# Patient Record
Sex: Female | Born: 1990 | Race: Black or African American | Hispanic: No | Marital: Single | Smoking: Never smoker
Health system: Southern US, Community
[De-identification: ages and names within clinical notes are randomized; demographics above are authoritative.]

## PROBLEM LIST (undated history)

## (undated) DIAGNOSIS — J45909 Unspecified asthma, uncomplicated: Secondary | ICD-10-CM

## (undated) DIAGNOSIS — L309 Dermatitis, unspecified: Secondary | ICD-10-CM

## (undated) HISTORY — DX: Unspecified asthma, uncomplicated: J45.909

## (undated) HISTORY — DX: Dermatitis, unspecified: L30.9

---

## 2012-08-11 ENCOUNTER — Encounter (HOSPITAL_BASED_OUTPATIENT_CLINIC_OR_DEPARTMENT_OTHER): Payer: Self-pay | Admitting: *Deleted

## 2012-08-11 ENCOUNTER — Emergency Department (HOSPITAL_BASED_OUTPATIENT_CLINIC_OR_DEPARTMENT_OTHER): Payer: No Typology Code available for payment source

## 2012-08-11 ENCOUNTER — Emergency Department (HOSPITAL_BASED_OUTPATIENT_CLINIC_OR_DEPARTMENT_OTHER)
Admission: EM | Admit: 2012-08-11 | Discharge: 2012-08-11 | Disposition: A | Discharge status: Home / Self Care | Payer: No Typology Code available for payment source | Attending: Emergency Medicine | Admitting: Emergency Medicine

## 2012-08-11 DIAGNOSIS — Y92410 Unspecified street and highway as the place of occurrence of the external cause: Secondary | ICD-10-CM | POA: Insufficient documentation

## 2012-08-11 DIAGNOSIS — M545 Low back pain, unspecified: Secondary | ICD-10-CM | POA: Insufficient documentation

## 2012-08-11 DIAGNOSIS — M25569 Pain in unspecified knee: Secondary | ICD-10-CM | POA: Insufficient documentation

## 2012-08-11 DIAGNOSIS — M542 Cervicalgia: Secondary | ICD-10-CM | POA: Insufficient documentation

## 2012-08-11 MED ORDER — IBUPROFEN 800 MG PO TABS: 800.0000 mg | ORAL_TABLET | Freq: Three times a day (TID) | ORAL | Status: DC

## 2012-08-11 MED ORDER — IBUPROFEN 800 MG PO TABS
800.0000 mg | ORAL_TABLET | Freq: Once | ORAL | Status: AC
  Administered 2012-08-11: 800 mg via ORAL
  Filled 2012-08-11: qty 1
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
## 2012-08-17 ENCOUNTER — Emergency Department (HOSPITAL_BASED_OUTPATIENT_CLINIC_OR_DEPARTMENT_OTHER)
Admission: EM | Admit: 2012-08-17 | Discharge: 2012-08-17 | Disposition: A | Discharge status: Home / Self Care | Payer: BC Managed Care – PPO | Attending: Emergency Medicine | Admitting: Emergency Medicine

## 2012-08-17 ENCOUNTER — Encounter (HOSPITAL_BASED_OUTPATIENT_CLINIC_OR_DEPARTMENT_OTHER): Payer: Self-pay | Admitting: *Deleted

## 2012-08-17 DIAGNOSIS — L02219 Cutaneous abscess of trunk, unspecified: Secondary | ICD-10-CM | POA: Insufficient documentation

## 2012-08-17 DIAGNOSIS — L0291 Cutaneous abscess, unspecified: Secondary | ICD-10-CM

## 2012-08-17 MED ORDER — HYDROCODONE-ACETAMINOPHEN 5-500 MG PO TABS: 1.0000 | ORAL_TABLET | Freq: Four times a day (QID) | ORAL | Status: DC | PRN

## 2012-08-17 MED ORDER — SULFAMETHOXAZOLE-TRIMETHOPRIM 800-160 MG PO TABS: 1.0000 | ORAL_TABLET | Freq: Two times a day (BID) | ORAL | Status: DC

## 2012-08-17 MED ORDER — LIDOCAINE HCL 2 % IJ SOLN
INTRAMUSCULAR | Status: AC
  Administered 2012-08-17: 400 mg
  Filled 2012-08-17: qty 20

## 2012-08-17 MED ORDER — CLINDAMYCIN HCL 300 MG PO CAPS: 300.0000 mg | ORAL_CAPSULE | Freq: Four times a day (QID) | ORAL | Status: DC

## 2012-08-17 NOTE — ED Provider Notes (Signed)
History     CSN: [id number]  Arrival date & time 08/17/12  1714   First MD Initiated Contact with Patient 08/17/12 1828      Chief Complaint  Patient presents with  . Abscess    (Consider location/radiation/quality/duration/timing/severity/associated sxs/prior treatment) Patient is a 21 y.o. female presenting with abscess. The history is provided by the patient.  Abscess  This is a new problem. Episode onset: 2 days ago. The onset was gradual. The problem occurs continuously. The problem has been gradually worsening. Affected Location: left torso. The problem is moderate. The abscess is characterized by redness, painfulness and swelling. It is unknown what she was exposed to.    History reviewed. No pertinent past medical history.  Past Surgical History  Procedure Date  . Cesarean section     History reviewed. No pertinent family history.  History  Substance Use Topics  . Smoking status: Never Smoker   . Smokeless tobacco: Not on file  . Alcohol Use: Yes    OB History    Grav Para Term Preterm Abortions TAB SAB Ect Mult Living                  Review of Systems  All other systems reviewed and are negative.    Allergies  Amoxicillin and Septra  Home Medications   Current Outpatient Rx  Name Route Sig Dispense Refill  . IBUPROFEN 800 MG PO TABS Oral Take 1 tablet (800 mg total) by mouth 3 (three) times daily. 21 tablet 0    BP 129/83  Pulse 78  Temp 99.1 F (37.3 C) (Oral)  Resp 16  Ht 5\' 5"  (1.651 m)  Wt 265 lb (120.203 kg)  BMI 44.10 kg/m2  SpO2 100%  LMP 07/22/2012  Physical Exam  Nursing note and vitals reviewed. Constitutional: She is oriented to person, place, and time. She appears well-developed and well-nourished. No distress.  HENT:  Head: Normocephalic and atraumatic.  Neck: Normal range of motion. Neck supple.  Musculoskeletal: Normal range of motion.  Neurological: She is alert and oriented to person, place, and time.  Skin: She  is not diaphoretic.       There is a 2.5 cm round fluctuant erythematous lesion to the left chest wall lateral to the breast.      ED Course  Procedures (including critical care time)  Labs Reviewed - No data to display No results found.   No diagnosis found.  INCISION AND DRAINAGE Performed by: Geoffery Lyons Consent: Verbal consent obtained. Risks and benefits: risks, benefits and alternatives were discussed Type: abscess  Body area: left torso  Anesthesia: local infiltration  Local anesthetic: lidocaine 1% without epinephrine  Anesthetic total: 2 ml  Complexity: complex Blunt dissection to break up loculations  Drainage: purulent  Drainage amount: moderate  Packing material: none  Patient tolerance: Patient tolerated the procedure well with no immediate complications.     MDM  Will treat with pain meds, bactrim, warm soaks.  Return prn.        Geoffery Lyons, MD 08/17/12 [phone]

## 2012-08-17 NOTE — ED Notes (Signed)
MD at bedside. 

## 2012-08-17 NOTE — ED Notes (Signed)
Pt c/o abscess to left flank x 3 days

## 2013-01-14 ENCOUNTER — Emergency Department (HOSPITAL_BASED_OUTPATIENT_CLINIC_OR_DEPARTMENT_OTHER)
Admission: EM | Admit: 2013-01-14 | Discharge: 2013-01-14 | Disposition: A | Discharge status: Home / Self Care | Payer: BC Managed Care – PPO | Attending: Emergency Medicine | Admitting: Emergency Medicine

## 2013-01-14 ENCOUNTER — Encounter (HOSPITAL_BASED_OUTPATIENT_CLINIC_OR_DEPARTMENT_OTHER): Payer: Self-pay | Admitting: Emergency Medicine

## 2013-01-14 DIAGNOSIS — Z87440 Personal history of urinary (tract) infections: Secondary | ICD-10-CM | POA: Insufficient documentation

## 2013-01-14 DIAGNOSIS — B9689 Other specified bacterial agents as the cause of diseases classified elsewhere: Secondary | ICD-10-CM

## 2013-01-14 DIAGNOSIS — Z3202 Encounter for pregnancy test, result negative: Secondary | ICD-10-CM | POA: Insufficient documentation

## 2013-01-14 DIAGNOSIS — N949 Unspecified condition associated with female genital organs and menstrual cycle: Secondary | ICD-10-CM | POA: Insufficient documentation

## 2013-01-14 DIAGNOSIS — N760 Acute vaginitis: Secondary | ICD-10-CM | POA: Insufficient documentation

## 2013-01-14 LAB — URINALYSIS, ROUTINE W REFLEX MICROSCOPIC
Glucose, UA: NEGATIVE mg/dL
Nitrite: NEGATIVE
Specific Gravity, Urine: 1.027 (ref 1.005–1.030)
pH: 6.5 (ref 5.0–8.0)

## 2013-01-14 LAB — WET PREP, GENITAL
Trich, Wet Prep: NONE SEEN
Yeast Wet Prep HPF POC: NONE SEEN

## 2013-01-14 LAB — PREGNANCY, URINE: Preg Test, Ur: NEGATIVE

## 2013-01-14 LAB — URINE MICROSCOPIC-ADD ON

## 2013-01-14 MED ORDER — METRONIDAZOLE 500 MG PO TABS: 500.0000 mg | ORAL_TABLET | Freq: Two times a day (BID) | ORAL | Status: DC

## 2013-01-14 MED ORDER — NITROFURANTOIN MONOHYD MACRO 100 MG PO CAPS: 100.0000 mg | ORAL_CAPSULE | Freq: Two times a day (BID) | ORAL | Status: AC

## 2013-01-14 NOTE — ED Provider Notes (Signed)
Medical screening examination/treatment/procedure(s) were performed by non-physician practitioner and as supervising physician I was immediately available for consultation/collaboration.   Loren Racer, MD 01/14/13 [phone]

## 2013-01-14 NOTE — ED Provider Notes (Signed)
History     CSN: [id number]  Arrival date & time 01/14/13  1737   First MD Initiated Contact with Patient 01/14/13 1803      Chief Complaint  Patient presents with  . Dysuria    (Consider location/radiation/quality/duration/timing/severity/associated sxs/prior treatment) Patient is a 22 y.o. female presenting with dysuria. The history is provided by the patient. No language interpreter was used.  Dysuria  This is a new problem. The problem occurs every urination. The problem has been gradually worsening. The pain is at a severity of 6/10. The pain is moderate. There has been no fever. She is not sexually active. She has tried nothing for the symptoms.  Pt reports she has had multiple urinary tract infections in the past  No past medical history on file.  Past Surgical History  Procedure Laterality Date  . Cesarean section      No family history on file.  History  Substance Use Topics  . Smoking status: Never Smoker   . Smokeless tobacco: Not on file  . Alcohol Use: Yes    OB History   Grav Para Term Preterm Abortions TAB SAB Ect Mult Living                  Review of Systems  Genitourinary: Positive for dysuria and vaginal pain.  All other systems reviewed and are negative.    Allergies  Amoxicillin and Septra  Home Medications   Current Outpatient Rx  Name  Route  Sig  Dispense  Refill  . ethynodiol-ethinyl estradiol (KELNOR,ZOVIA) 1-35 MG-MCG tablet   Oral   Take 1 tablet by mouth daily.         . montelukast (SINGULAIR) 10 MG tablet   Oral   Take 10 mg by mouth at bedtime.           BP 143/78  Pulse 70  Temp(Src) 98.2 F (36.8 C) (Oral)  Resp 15  Ht 5\' 5"  (1.651 m)  Wt 265 lb (120.203 kg)  BMI 44.1 kg/m2  SpO2 100%  LMP 01/02/2013  Physical Exam  Nursing note and vitals reviewed. Constitutional: She appears well-developed and well-nourished.  HENT:  Head: Normocephalic and atraumatic.  Right Ear: External ear normal.  Eyes:  Conjunctivae and EOM are normal. Pupils are equal, round, and reactive to light.  Neck: Normal range of motion. Neck supple.  Cardiovascular: Normal rate and normal heart sounds.   Pulmonary/Chest: Effort normal and breath sounds normal.  Abdominal: Soft. Bowel sounds are normal. There is tenderness.  Genitourinary: Uterus normal. Vaginal discharge found.  Musculoskeletal: Normal range of motion.  Neurological: She is alert.  Skin: Skin is warm.  Psychiatric: She has a normal mood and affect.    ED Course  Procedures (including critical care time)  Labs Reviewed  WET PREP, GENITAL - Abnormal; Notable for the following:    Clue Cells Wet Prep HPF POC FEW (*)    WBC, Wet Prep HPF POC MODERATE (*)    All other components within normal limits  URINALYSIS, ROUTINE W REFLEX MICROSCOPIC - Abnormal; Notable for the following:    APPearance CLOUDY (*)    Urobilinogen, UA 2.0 (*)    Leukocytes, UA MODERATE (*)    All other components within normal limits  URINE MICROSCOPIC-ADD ON - Abnormal; Notable for the following:    Squamous Epithelial / LPF MANY (*)    Bacteria, UA MANY (*)    All other components within normal limits  URINE CULTURE  GC/CHLAMYDIA PROBE  AMP  PREGNANCY, URINE   No results found.   No diagnosis found.    MDM  Flagyl 500mg  14 bid        Lonia Skinner [place], [place] 01/14/13 1939

## 2013-01-14 NOTE — ED Notes (Addendum)
Pt having dysuria since Thursday.  Some chills.  Some itching and clear vaginal discharge.

## 2013-01-16 LAB — URINE CULTURE
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
## 2014-03-27 ENCOUNTER — Encounter (HOSPITAL_BASED_OUTPATIENT_CLINIC_OR_DEPARTMENT_OTHER): Payer: Self-pay | Admitting: Emergency Medicine

## 2014-03-27 ENCOUNTER — Emergency Department (HOSPITAL_BASED_OUTPATIENT_CLINIC_OR_DEPARTMENT_OTHER): Payer: BC Managed Care – PPO

## 2014-03-27 ENCOUNTER — Emergency Department (HOSPITAL_BASED_OUTPATIENT_CLINIC_OR_DEPARTMENT_OTHER)
Admission: EM | Admit: 2014-03-27 | Discharge: 2014-03-27 | Disposition: A | Discharge status: Home / Self Care | Payer: BC Managed Care – PPO | Attending: Emergency Medicine | Admitting: Emergency Medicine

## 2014-03-27 DIAGNOSIS — Z3202 Encounter for pregnancy test, result negative: Secondary | ICD-10-CM | POA: Insufficient documentation

## 2014-03-27 DIAGNOSIS — Z792 Long term (current) use of antibiotics: Secondary | ICD-10-CM | POA: Insufficient documentation

## 2014-03-27 DIAGNOSIS — S99929A Unspecified injury of unspecified foot, initial encounter: Principal | ICD-10-CM

## 2014-03-27 DIAGNOSIS — S99919A Unspecified injury of unspecified ankle, initial encounter: Principal | ICD-10-CM

## 2014-03-27 DIAGNOSIS — S8990XA Unspecified injury of unspecified lower leg, initial encounter: Secondary | ICD-10-CM

## 2014-03-27 DIAGNOSIS — Y9339 Activity, other involving climbing, rappelling and jumping off: Secondary | ICD-10-CM | POA: Insufficient documentation

## 2014-03-27 DIAGNOSIS — Z880 Allergy status to penicillin: Secondary | ICD-10-CM | POA: Insufficient documentation

## 2014-03-27 DIAGNOSIS — X500XXA Overexertion from strenuous movement or load, initial encounter: Secondary | ICD-10-CM | POA: Insufficient documentation

## 2014-03-27 DIAGNOSIS — Y929 Unspecified place or not applicable: Secondary | ICD-10-CM | POA: Insufficient documentation

## 2014-03-27 LAB — PREGNANCY, URINE: Preg Test, Ur: NEGATIVE

## 2014-03-27 MED ORDER — IBUPROFEN 800 MG PO TABS
800.0000 mg | ORAL_TABLET | Freq: Once | ORAL | Status: AC
  Administered 2014-03-27: 800 mg via ORAL
  Filled 2014-03-27: qty 1

## 2014-03-27 MED ORDER — HYDROCODONE-ACETAMINOPHEN 5-325 MG PO TABS
1.0000 | ORAL_TABLET | Freq: Once | ORAL | Status: AC
  Administered 2014-03-27: 1 via ORAL
  Filled 2014-03-27: qty 1

## 2014-03-27 MED ORDER — IBUPROFEN 800 MG PO TABS: 800.0000 mg | ORAL_TABLET | Freq: Three times a day (TID) | ORAL | Status: DC

## 2014-03-27 MED ORDER — HYDROCODONE-ACETAMINOPHEN 5-325 MG PO TABS: 2.0000 | ORAL_TABLET | ORAL | Status: DC | PRN

## 2014-03-27 NOTE — Discharge Instructions (Signed)
Knee Pain Knee pain can be a result of an injury or other medical conditions. Treatment will depend on the cause of your pain. HOME CARE  Only take medicine as told by your doctor.  Keep a healthy weight. Being overweight can make the knee hurt more.  Stretch before exercising or playing sports.  If there is constant knee pain, change the way you exercise. Ask your doctor for advice.  Make sure shoes fit well. Choose the right shoe for the sport or activity.  Protect your knees. Wear kneepads if needed.  Rest when you are tired. GET HELP RIGHT AWAY IF:   Your knee pain does not stop.  Your knee pain does not get better.  Your knee joint feels hot to the touch.  You have a fever. MAKE SURE YOU:   Understand these instructions.  Will watch this condition.  Will get help right away if you are not doing well or get worse. Document Released: 01/15/2009 Document Revised: 01/11/2012 Document Reviewed: 01/15/2009 [employer] Patient Information 2014 [employer].  

## 2014-03-27 NOTE — ED Notes (Signed)
patient aware of needed urine sample.
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
## 2014-06-03 ENCOUNTER — Emergency Department (HOSPITAL_BASED_OUTPATIENT_CLINIC_OR_DEPARTMENT_OTHER)
Admission: EM | Admit: 2014-06-03 | Discharge: 2014-06-03 | Disposition: A | Discharge status: Home / Self Care | Payer: BC Managed Care – PPO | Attending: Emergency Medicine | Admitting: Emergency Medicine

## 2014-06-03 ENCOUNTER — Encounter (HOSPITAL_BASED_OUTPATIENT_CLINIC_OR_DEPARTMENT_OTHER): Payer: Self-pay | Admitting: Emergency Medicine

## 2014-06-03 DIAGNOSIS — Z880 Allergy status to penicillin: Secondary | ICD-10-CM | POA: Insufficient documentation

## 2014-06-03 DIAGNOSIS — H66012 Acute suppurative otitis media with spontaneous rupture of ear drum, left ear: Secondary | ICD-10-CM

## 2014-06-03 DIAGNOSIS — J0100 Acute maxillary sinusitis, unspecified: Secondary | ICD-10-CM | POA: Insufficient documentation

## 2014-06-03 DIAGNOSIS — R51 Headache: Secondary | ICD-10-CM | POA: Insufficient documentation

## 2014-06-03 DIAGNOSIS — H66019 Acute suppurative otitis media with spontaneous rupture of ear drum, unspecified ear: Secondary | ICD-10-CM | POA: Insufficient documentation

## 2014-06-03 DIAGNOSIS — Z792 Long term (current) use of antibiotics: Secondary | ICD-10-CM | POA: Insufficient documentation

## 2014-06-03 DIAGNOSIS — Z791 Long term (current) use of non-steroidal anti-inflammatories (NSAID): Secondary | ICD-10-CM | POA: Insufficient documentation

## 2014-06-03 MED ORDER — AZITHROMYCIN 250 MG PO TABS: 250.0000 mg | ORAL_TABLET | Freq: Every day | ORAL | Status: DC

## 2014-06-03 MED ORDER — LORATADINE 10 MG PO TABS: 10.0000 mg | ORAL_TABLET | Freq: Every day | ORAL | Status: DC | PRN
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
## 2014-06-03 NOTE — Discharge Instructions (Signed)
Otitis Media With Effusion Otitis media with effusion is the presence of fluid in the middle ear. This is a common problem in children, which often follows ear infections. It may be present for weeks or longer after the infection. Unlike an acute ear infection, otitis media with effusion refers only to fluid behind the ear drum and not infection. Children with repeated ear and sinus infections and allergy problems are the most likely to get otitis media with effusion. CAUSES  The most frequent cause of the fluid buildup is dysfunction of the eustachian tubes. These are the tubes that drain fluid in the ears to the back of the nose (nasopharynx). SYMPTOMS   The main symptom of this condition is hearing loss. As a result, you or your child may:  Listen to the TV at a loud volume.  Not respond to questions.  Ask "what" often when spoken to.  Mistake or confuse one sound or word for another.  There may be a sensation of fullness or pressure but usually not pain. DIAGNOSIS   Your health care provider will diagnose this condition by examining you or your child's ears.  Your health care provider may test the pressure in you or your child's ear with a tympanometer.  A hearing test may be conducted if the problem persists. TREATMENT   Treatment depends on the duration and the effects of the effusion.  Antibiotics, decongestants, nose drops, and cortisone-type drugs (tablets or nasal spray) may not be helpful.  Children with persistent ear effusions may have delayed language or behavioral problems. Children at risk for developmental delays in hearing, learning, and speech may require referral to a specialist earlier than children not at risk.  You or your child's health care provider may suggest a referral to an ear, nose, and throat surgeon for treatment. The following may help restore normal hearing:  Drainage of fluid.  Placement of ear tubes (tympanostomy tubes).  Removal of adenoids  (adenoidectomy). HOME CARE INSTRUCTIONS   Avoid secondhand smoke.  Infants who are breastfed are less likely to have this condition.  Avoid feeding infants while they are lying flat.  Avoid known environmental allergens.  Avoid people who are sick. SEEK MEDICAL CARE IF:   Hearing is not better in 3 months.  Hearing is worse.  Ear pain.  Drainage from the ear.  Dizziness. MAKE SURE YOU:   Understand these instructions.  Will watch your condition.  Will get help right away if you are not doing well or get worse. Document Released: 11/26/2004 Document Revised: 03/05/2014 Document Reviewed: 05/16/2013 [employer] Patient Information 2015 [employer]. This information is not intended to replace advice given to you by your health care provider. Make sure you discuss any questions you have with your health care provider. Sinusitis Sinusitis is redness, soreness, and inflammation of the paranasal sinuses. Paranasal sinuses are air pockets within the bones of your face (beneath the eyes, the middle of the forehead, or above the eyes). In healthy paranasal sinuses, mucus is able to drain out, and air is able to circulate through them by way of your nose. However, when your paranasal sinuses are inflamed, mucus and air can become trapped. This can allow bacteria and other germs to grow and cause infection. Sinusitis can develop quickly and last only a short time (acute) or continue over a long period (chronic). Sinusitis that lasts for more than 12 weeks is considered chronic.  CAUSES  Causes of sinusitis include:  Allergies.  Structural abnormalities, such as   displacement of the cartilage that separates your nostrils (deviated septum), which can decrease the air flow through your nose and sinuses and affect sinus drainage.  Functional abnormalities, such as when the small hairs (cilia) that line your sinuses and help remove mucus do not work properly or are not present. SIGNS AND  SYMPTOMS  Symptoms of acute and chronic sinusitis are the same. The primary symptoms are pain and pressure around the affected sinuses. Other symptoms include:  Upper toothache.  Earache.  Headache.  Bad breath.  Decreased sense of smell and taste.  A cough, which worsens when you are lying flat.  Fatigue.  Fever.  Thick drainage from your nose, which often is green and may contain pus (purulent).  Swelling and warmth over the affected sinuses. DIAGNOSIS  Your health care provider will perform a physical exam. During the exam, your health care provider may:  Look in your nose for signs of abnormal growths in your nostrils (nasal polyps).  Tap over the affected sinus to check for signs of infection.  View the inside of your sinuses (endoscopy) using an imaging device that has a light attached (endoscope). If your health care provider suspects that you have chronic sinusitis, one or more of the following tests may be recommended:  Allergy tests.  Nasal culture. A sample of mucus is taken from your nose, sent to a lab, and screened for bacteria.  Nasal cytology. A sample of mucus is taken from your nose and examined by your health care provider to determine if your sinusitis is related to an allergy. TREATMENT  Most cases of acute sinusitis are related to a viral infection and will resolve on their own within 10 days. Sometimes medicines are prescribed to help relieve symptoms (pain medicine, decongestants, nasal steroid sprays, or saline sprays).  However, for sinusitis related to a bacterial infection, your health care provider will prescribe antibiotic medicines. These are medicines that will help kill the bacteria causing the infection.  Rarely, sinusitis is caused by a fungal infection. In theses cases, your health care provider will prescribe antifungal medicine. For some cases of chronic sinusitis, surgery is needed. Generally, these are cases in which sinusitis recurs  more than 3 times per year, despite other treatments. HOME CARE INSTRUCTIONS   Drink plenty of water. Water helps thin the mucus so your sinuses can drain more easily.  Use a humidifier.  Inhale steam 3 to 4 times a day (for example, sit in the bathroom with the shower running).  Apply a warm, moist washcloth to your face 3 to 4 times a day, or as directed by your health care provider.  Use saline nasal sprays to help moisten and clean your sinuses.  Take medicines only as directed by your health care provider.  If you were prescribed either an antibiotic or antifungal medicine, finish it all even if you start to feel better. SEEK IMMEDIATE MEDICAL CARE IF:  You have increasing pain or severe headaches.  You have nausea, vomiting, or drowsiness.  You have swelling around your face.  You have vision problems.  You have a stiff neck.  You have difficulty breathing. MAKE SURE YOU:   Understand these instructions.  Will watch your condition.  Will get help right away if you are not doing well or get worse. Document Released: 10/19/2005 Document Revised: 03/05/2014 Document Reviewed: 11/03/2011 [employer] Patient Information 2015 [employer]. This information is not intended to replace advice given to you by your health care provider. Make sure   you discuss any questions you have with your health care provider.  

## 2014-06-03 NOTE — ED Notes (Signed)
Pt reports sinus and ear pressure since yesterday with cough.

## 2020-09-30 DIAGNOSIS — A6004 Herpesviral vulvovaginitis: Secondary | ICD-10-CM | POA: Insufficient documentation

## 2022-09-22 ENCOUNTER — Ambulatory Visit: Payer: Managed Care, Other (non HMO) | Admitting: Internal Medicine

## 2022-09-22 ENCOUNTER — Other Ambulatory Visit (HOSPITAL_COMMUNITY): Payer: Self-pay

## 2022-09-22 ENCOUNTER — Other Ambulatory Visit: Payer: Self-pay | Admitting: Internal Medicine

## 2022-09-22 ENCOUNTER — Encounter: Payer: Self-pay | Admitting: Internal Medicine

## 2022-09-22 VITALS — BP 114/70 | HR 83 | Temp 97.9°F | Resp 17 | Ht 66.05 in | Wt 274.4 lb

## 2022-09-22 DIAGNOSIS — L2084 Intrinsic (allergic) eczema: Secondary | ICD-10-CM

## 2022-09-22 DIAGNOSIS — H1045 Other chronic allergic conjunctivitis: Secondary | ICD-10-CM | POA: Diagnosis not present

## 2022-09-22 DIAGNOSIS — J3089 Other allergic rhinitis: Secondary | ICD-10-CM | POA: Diagnosis not present

## 2022-09-22 DIAGNOSIS — R062 Wheezing: Secondary | ICD-10-CM

## 2022-09-22 DIAGNOSIS — T7800XA Anaphylactic reaction due to unspecified food, initial encounter: Secondary | ICD-10-CM | POA: Diagnosis not present

## 2022-09-22 DIAGNOSIS — J302 Other seasonal allergic rhinitis: Secondary | ICD-10-CM

## 2022-09-22 MED ORDER — AZELASTINE HCL 0.1 % NA SOLN: 2.0000 | Freq: Two times a day (BID) | NASAL | 6 refills | Status: DC

## 2022-09-22 MED ORDER — HYDROCORTISONE 2.5 % EX OINT: TOPICAL_OINTMENT | CUTANEOUS | 3 refills | Status: DC

## 2022-09-22 MED ORDER — EUCRISA 2 % EX OINT: TOPICAL_OINTMENT | CUTANEOUS | 5 refills | Status: DC

## 2022-09-22 MED ORDER — MONTELUKAST SODIUM 10 MG PO TABS: 10.0000 mg | ORAL_TABLET | Freq: Every day | ORAL | 1 refills | Status: DC

## 2022-09-22 MED ORDER — ALBUTEROL SULFATE HFA 108 (90 BASE) MCG/ACT IN AERS: 2.0000 | INHALATION_SPRAY | Freq: Four times a day (QID) | RESPIRATORY_TRACT | 2 refills | Status: DC | PRN

## 2022-09-22 MED ORDER — FLUTICASONE PROPIONATE 50 MCG/ACT NA SUSP: 2.0000 | Freq: Every day | NASAL | 1 refills | Status: AC

## 2022-09-22 MED ORDER — EPINEPHRINE 0.3 MG/0.3ML IJ SOAJ: 0.3000 mg | INTRAMUSCULAR | 1 refills | Status: DC | PRN

## 2022-09-22 NOTE — Progress Notes (Signed)
New Patient Note  RE: Kerry Smith MRN: [id number] DOB: 03/29/1991 Date of Office Visit: 09/22/2022  Consult requested by: Windell Hummingbird, PA-C Primary care provider: Windell Hummingbird, PA-C  Chief Complaint: Allergic Rhinitis  (Pt states that she's been having a lot of allergy symptoms flaring up. Mainly around eyes she's been breaking out, eyes been really itchy, dry, red. She also been very congested, wheezing, runny nose.) and Dry Eye  History of Present Illness: I had the pleasure of seeing Kerry Smith for initial evaluation at the [facility] [place] [facility] [place] on 09/22/2022. She is a 31 y.o. female, who is referred here by Windell Hummingbird, PA-C for the evaluation of food allergies, atopic dermatitis  and rhinitis .  History obtained from patient, chart review.  Concern for Food Allergy:  Food of concern: pineapple, tree nuts, dairy, seafood  History of reaction: citrus causes mouth itching, she is avoiding tree nuts and almonds from previous testing.  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 female with: Intrinsic atopic dermatitis - Plan: Allergy Test, Interdermal Allergy Test  Allergy with anaphylaxis due to food - Plan: Allergy Test, Interdermal Allergy Test  Seasonal and perennial allergic rhinitis - Plan: Allergy Test, Interdermal Allergy Test  Other chronic allergic conjunctivitis of both eyes - Plan: Allergy Test, Interdermal Allergy Test  Wheezing - Plan: Allergy Test, Spirometry with Graph, Interdermal Allergy Test Plan: Patient Instructions  Atopic Dermatitis: not well controlled  Daily Care For Maintenance (daily and continue even once eczema controlled) - Recommend hypoallergenic hydrating ointment at least twice daily.  This must be done daily for control of flares. (Great options include Vaseline, CeraVe, Aquaphor, Aveeno, Cetaphil, VaniCream, etc) - Recommend avoiding detergents, soaps or lotions with fragrances/dyes, and instead using products which are hypoallergenic, use second rinse cycle when washing clothes -Wear lose breathable clothing, avoid wool -Avoid extremes of humidity - Limit showers/baths to 5 minutes and use luke warm water instead of hot, pat dry following baths, and apply moisturizer - START Eucrisa twice daily  - can use steroid creams as detailed below up to twice weekly for prevention of flares.  For Flares:(add this to maintenance therapy if needed for flares) - Hydrocortisone 2.5% to face, armpit or groin-apply topically twice daily to red, raised areas of skin, followed by moisturizer -Can use with Eucrisa   Seasonal and Perennial  Rhinitis not well controlled : - allergy testing today was skin prick positive to grass pollen, weed pollen, tree  pollen, mold, dust mite Intradermal testing positive to cat, dog, roach - allergen avoidance as below - consider allergy shots as  long term control of your symptoms by teaching your immune system to be more tolerant of your allergy triggers - STOP allegra -D (the D portion is not good to take long term)  - Start Nasal Steroid Spray: Options include Flonase (fluticasone), Nasocort (triamcinolone), Nasonex (mometasome) 1- 2 sprays in each nostril daily (can buy over-the-counter if not covered by insurance)  Best results if used daily. - Start Astelin (Azelastine) 1-2 sprays in each nostril twice a day as needed.  You may use this as needed for nasal congestion/itchy ears/itchy nose if desired - Start Singulair (Montelukast) 10mg  nightly. - Continue over the counter antihistamine daily or daily as needed.   -Your options include Zyrtec (Cetirizine) 10mg , Claritin (Loratadine) 10mg , Allegra (Fexofenadine) 180mg , or Xyzal (Levocetirinze) 5mg   Allergic Conjunctivitis:  - Consider Allergy Eye drops: great options include Pataday (Olopatadine) or Zaditor (ketotifen) for eye symptoms daily as needed-both sold over the counter if not covered by insurance.   -Avoid eye drops that say red eye relief as they may contain medications that dry out your eyes.  Food allergy:  - today's skin testing was positive to hazelnut and pistachio  - please strictly avoid tree nuts  - okay to resume eating seafood, dairy - for SKIN only reaction, okay to take Benadryl 2 capsules every 6 hours - for SKIN + ANY additional symptoms, OR IF concern for LIFE THREATENING reaction = Epipen Autoinjector EpiPen 0.3 mg. - If using Epinephrine autoinjector, call 911 - A food allergy action plan has been provided and discussed. - Medic Alert identification is recommended.  Follow up: 4 weeks   Thank you so much for letting me partake in your care today.  Don't hesitate to reach out if you have any additional concerns!  Roney Marion, MD  [facility] [place] [place]   Reducing Pollen Exposure  The American Academy of Allergy, Asthma and  Immunology suggests the following steps to reduce your exposure to pollen during allergy seasons.    Do not hang sheets or clothing out to dry; pollen may collect on these items. Do not mow lawns or spend time around freshly cut grass; mowing stirs up pollen. Keep windows closed at night.  Keep car windows closed while driving. Minimize morning activities outdoors, a time when pollen counts are usually at their highest. Stay indoors as much as possible when pollen counts or humidity is high and on windy days when pollen tends to remain in the air longer. Use air conditioning when possible.  Many air conditioners have filters that trap the pollen spores. Use a HEPA room air filter to remove pollen form the indoor air you breathe.  DUST MITE AVOIDANCE MEASURES:  There are three main measures that need and can be taken to avoid house dust mites:  Reduce accumulation of dust in general -reduce furniture, clothing, carpeting, books, stuffed animals, especially in bedroom  Separate yourself from the dust -use pillow and mattress encasements (can be found at stores such as [employer] or online) -avoid direct exposure to air condition flow -use a HEPA filter device, especially in the bedroom; you can also use a HEPA filter vacuum cleaner -wipe dust with a moist towel instead of a dry towel or broom when cleaning  Decrease mites and/or their secretions -wash clothing and linen and stuffed animals at highest temperature possible, at  least every 2 weeks -stuffed animals can also be placed in a bag and put in a freezer overnight  Despite the above measures, it is impossible to eliminate dust mites or their allergen completely from your home.  With the above measures the burden of mites in your home can be diminished, with the goal of minimizing your allergic symptoms.  Success will be reached only when implementing and using all means together.  Control of Cockroach Allergen  Cockroach  allergen has been identified as an important cause of acute attacks of asthma, especially in urban settings.  There are fifty-five species of cockroach that exist in the [place], however only three, the [place], [place] species produce allergen that can affect patients with Asthma.  Allergens can be obtained from fecal particles, egg casings and secretions from cockroaches.    Remove food sources. Reduce access to water. Seal access and entry points. Spray runways with 0.5-1% Diazinon or Chlorpyrifos Blow boric acid power under stoves and refrigerator. Place bait stations (hydramethylnon) at feeding sites.  Control of Dog or Cat Allergen  Avoidance is the best way to manage a dog or cat allergy. If you have a dog or cat and are allergic to dog or cats, consider removing the dog or cat from the home. If you have a dog or cat but don't want to find it a new home, or if your family wants a pet even though someone in the household is allergic, here are some strategies that may help keep symptoms at bay:  Keep the pet out of your bedroom and restrict it to only a few rooms. Be advised that keeping the dog or cat in only one room will not limit the allergens to that room. Don't pet, hug or kiss the dog or cat; if you do, wash your hands with soap and water. High-efficiency particulate air (HEPA) cleaners run continuously in a bedroom or living room can reduce allergen levels over time. Regular use of a high-efficiency vacuum cleaner or a central vacuum can reduce allergen levels. Giving your dog or cat a bath at least once a week can reduce airborne allergen.  Control of Mold Allergen   Mold and fungi can grow on a variety of surfaces provided certain temperature and moisture conditions exist.  Outdoor molds grow on plants, decaying vegetation and soil.  The major outdoor mold, Alternaria and Cladosporium, are found in very high numbers during hot and dry conditions.  Generally, a  late Summer - Fall peak is seen for common outdoor fungal spores.  Rain will temporarily lower outdoor mold spore count, but counts rise rapidly when the rainy period ends.  The most important indoor molds are Aspergillus and Penicillium.  Dark, humid and poorly ventilated basements are ideal sites for mold growth.  The next most common sites of mold growth are the bathroom and the kitchen.  Outdoor (Seasonal) Mold Control  Positive outdoor molds via skin testing: Cladosporium  Use air conditioning and keep windows closed Avoid exposure to decaying vegetation. Avoid leaf raking. Avoid grain handling. Consider wearing a face mask if working in moldy areas.       Meds ordered this encounter  Medications   albuterol (VENTOLIN HFA) 108 (90 Base) MCG/ACT inhaler    Sig: Inhale 2 puffs into the lungs every 6 (six) hours as needed for wheezing or shortness of breath.    Dispense:  8 g    Refill:  2   EUCRISA 2 % OINT  Sig: Apply  Topical Twice Daily    Dispense:  60 g    Refill:  5   hydrocortisone 2.5 % ointment    Sig: Apply to face, armpit or groin-apply topically twice daily to red, raised areas of skin, followed by moisturizer    Dispense:  30 g    Refill:  3   azelastine (ASTELIN) 0.1 % nasal spray    Sig: Place 2 sprays into both nostrils 2 (two) times daily. Use in each nostril as directed    Dispense:  30 mL    Refill:  6   fluticasone (FLONASE) 50 MCG/ACT nasal spray    Sig: Place 2 sprays into both nostrils daily.    Dispense:  16 g    Refill:  1   montelukast (SINGULAIR) 10 MG tablet    Sig: Take 1 tablet (10 mg total) by mouth at bedtime.    Dispense:  90 tablet    Refill:  1   EPINEPHrine 0.3 mg/0.3 mL IJ SOAJ injection    Sig: Inject 0.3 mg into the muscle as needed for anaphylaxis.    Dispense:  1 each    Refill:  1   Lab Orders  No laboratory test(s) ordered today    Other allergy screening: Asthma: yes Rhino conjunctivitis: yes Food allergy:  yes Medication allergy: yes Hymenoptera allergy: no Urticaria: no Eczema:yes History of recurrent infections suggestive of immunodeficency: no  Diagnostics: Spirometry:  Tracings reviewed. Her effort: Good reproducible efforts. FVC: 3.94L FEV1: 3.35L, 114% predicted FEV1/FVC ratio: 85% Interpretation: Spirometry consistent with normal pattern.  Please see scanned spirometry results for details.  Skin Testing: Environmental allergy panel and select foods. - allergy testing today was skin prick positive to grass pollen, weed pollen, tree pollen, mold, dust mite Intradermal testing positive to cat, dog, roach Results interpreted by myself and discussed with patient/family.  Airborne Adult Perc - 09/22/22 1007     Time Antigen Placed 1007    Allergen Manufacturer Greer    Location Back    Number of Test 58    Panel 1 Select    1. Control-Buffer 50% Glycerol Negative    2. Control-Histamine 1 mg/ml 4+    3. Albumin saline Negative    4. Bahia 3+    5. [place] 4+    6. Johnson 3+    7. [place] Blue 4+    9. Perennial Rye Negative    10. Sweet Vernal Negative    11. Timothy Negative    12. Cocklebur Negative    13. Burweed Marshelder Negative    14. Ragweed, short 4+    15. Ragweed, Giant 3+    16. Plantain,  English Negative    17. Lamb's Quarters 3+    18. Sheep Sorrell Negative    19. Rough Pigweed 3+    20. Marsh Elder, Rough Negative    21. Mugwort, Common Negative    22. Ash mix 4+    23. Birch mix 4+    24. Beech American 3+    25. Box, Elder 3+    26. Cedar, red Negative    27. Cottonwood, Guinea[place]Biss[place] Negative    28. Elm mix 3+    29. Hickory 4+    30. Maple mix 3+    31. Oak, [place] mix 4+    32. Pecan Pollen 4+    33. Pine mix 3+    34. Sycamore Eastern Negative    35. Walnut, Black Pollen 4+  36. Alternaria alternata Negative    37. Cladosporium Herbarum 3+    38. Aspergillus mix Negative    39. Penicillium mix Negative    40. Bipolaris sorokiniana  (Helminthosporium) Negative    41. Drechslera spicifera (Curvularia) Negative    42. Mucor plumbeus Negative    43. Fusarium moniliforme Negative    44. Aureobasidium pullulans (pullulara) Negative    45. Rhizopus oryzae Negative    46. Botrytis cinera Negative    47. Epicoccum nigrum Negative    48. Phoma betae Negative    49. Candida Albicans Negative    50. Trichophyton mentagrophytes Negative    51. Mite, D Farinae  5,000 AU/ml 4+    52. Mite, D Pteronyssinus  5,000 AU/ml 4+    53. Cat Hair 10,000 BAU/ml Negative    54.  Dog Epithelia Negative    55. Mixed Feathers Negative    56. Horse Epithelia Negative    57. Cockroach, German Negative    58. Mouse Negative    59. Tobacco Leaf Negative             Intradermal - 09/22/22 1134     Time Antigen Placed 1134    Allergen Manufacturer Greer    Location Arm    Number of Test 8    Intradermal Select    Control Negative    Mold 1 Negative    Mold 2 Negative    Mold 3 Negative    Mold 4 Negative    Cat 4+    Dog 3+    Cockroach 3+             Food Adult Perc - 09/22/22 1000     Time Antigen Placed 1009    Allergen Manufacturer Greer    Location Back    Number of allergen test 16    1. Peanut Negative    5. Milk, cow Negative    6. Egg White, Chicken Negative    7. Casein Negative    10. Cashew Negative    11. Pecan Food Negative    12. [place] Negative    13. Almond Negative    14. Hazelnut 2+    15. [place] nut Negative    16. Coconut Negative    17. Pistachio 3+    25. Shrimp Negative    26. Crab Negative    27. Lobster Negative    28. Oyster Negative    29. Scallops Negative             Past Medical History: There are no problems to display for this patient.  Past Medical History:  Diagnosis Date   Asthma    Eczema    Past Surgical History: Past Surgical History:  Procedure Laterality Date   CESAREAN SECTION     Medication List:  Current Outpatient Medications  Medication  Sig Dispense Refill   albuterol (VENTOLIN HFA) 108 (90 Base) MCG/ACT inhaler Inhale 2 puffs into the lungs every 6 (six) hours as needed for wheezing or shortness of breath. 8 g 2   azelastine (ASTELIN) 0.1 % nasal spray Place 2 sprays into both nostrils 2 (two) times daily. Use in each nostril as directed 30 mL 6   Diethylpropion HCl CR 75 MG TB24 Take 1 tablet by mouth daily.     doxycycline (ADOXA) 100 MG tablet Take 100 mg by mouth 2 (two) times daily.     EPINEPHrine 0.3 mg/0.3 mL IJ SOAJ injection Inject 0.3 mg into the  muscle as needed for anaphylaxis. 1 each 1   ethynodiol-ethinyl estradiol (KELNOR,ZOVIA) 1-35 MG-MCG tablet Take 1 tablet by mouth daily.     fluconazole (DIFLUCAN) 100 MG tablet Take 1 tablet by mouth daily.     fluticasone (FLONASE) 50 MCG/ACT nasal spray Place 2 sprays into both nostrils daily. 16 g 1   hydrocortisone 2.5 % ointment Apply to face, armpit or groin-apply topically twice daily to red, raised areas of skin, followed by moisturizer 30 g 3   ibuprofen (ADVIL,MOTRIN) 800 MG tablet Take 1 tablet (800 mg total) by mouth 3 (three) times daily. 21 tablet 0   montelukast (SINGULAIR) 10 MG tablet Take 1 tablet (10 mg total) by mouth at bedtime. 90 tablet 1   SIMPESSE 0.15-0.03 &0.01 MG tablet Take 1 tablet by mouth daily.     valACYclovir (VALTREX) 500 MG tablet Take by mouth.     azithromycin (ZITHROMAX Z-PAK) 250 MG tablet Take 1 tablet (250 mg total) by mouth daily. 2 pills (500mg ) day 1 then 1 pill (250mg ) days 2-5. (Patient not taking: Reported on 09/22/2022) 6 tablet 0   cetirizine (ZYRTEC) 10 MG tablet Take 10 mg by mouth daily. (Patient not taking: Reported on 09/22/2022)     EUCRISA 2 % OINT Apply  Topical Twice Daily 60 g 5   HYDROcodone-acetaminophen (NORCO/VICODIN) 5-325 MG per tablet Take 2 tablets by mouth every 4 (four) hours as needed. (Patient not taking: Reported on 09/22/2022) 10 tablet 0   loratadine (CLARITIN) 10 MG tablet Take 1 tablet (10 mg  total) by mouth daily as needed for allergies. (Patient not taking: Reported on 09/22/2022) 3 tablet 00   metroNIDAZOLE (FLAGYL) 500 MG tablet Take 1 tablet (500 mg total) by mouth 2 (two) times daily. (Patient not taking: Reported on 09/22/2022) 14 tablet 0   No current facility-administered medications for this visit.   Allergies: Allergies  Allergen Reactions   Amoxicillin     rash   Septra [Sulfamethoxazole-Trimethoprim]     rash   Social History: Social History   Socioeconomic History   Marital status: Single    Spouse name: Not on file   Number of children: Not on file   Years of education: Not on file   Highest education level: Not on file  Occupational History   Not on file  Tobacco Use   Smoking status: Never   Smokeless tobacco: Not on file  Vaping Use   Vaping Use: Never used  Substance and Sexual Activity   Alcohol use: Yes    Alcohol/week: 1.0 - 2.0 standard drink of alcohol    Types: 1 - 2 Shots of liquor per week    Comment: occasional   Drug use: No   Sexual activity: Yes    Birth control/protection: Pill  Other Topics Concern   Not on file  Social History Narrative   Pt has a son at home   Social Determinants of Health   Financial Resource Strain: Not on file  Food Insecurity: Not on file  Transportation Needs: Not on file  Physical Activity: Not on file  Stress: Not on file  Social Connections: Not on file   Lives in a single-family home, there are no roaches in the house and bed is 2 feet off the floor.  There are no dust mite precautions on better pillows.  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  There is no HEPA filter in the home and home is not  near an interstate industrial area. 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   Objective: BP 114/70   Pulse 83   Temp 97.9 F (36.6 C) (Temporal)   Resp 17   Ht 5' 6.05" (1.678 m)   Wt 274 lb 6.4 oz (124.5 kg)   SpO2 100%   BMI 44.22 kg/m  Body mass index is 44.22 kg/m.  General Appearance:  Alert, cooperative, no distress, appears stated age  Head:  Normocephalic, without obvious abnormality, atraumatic  Eyes:  Conjunctiva clear, EOM's intact  Nose: Nares normal,  edematous nasal mucosa with clear rhinnorhea , hypertrophic turbinates, no visible anterior polyps, and septum midline  Throat: Lips, tongue normal; teeth and gums normal, no tonsillar exudate and + cobblestoning  Neck: Supple, symmetrical  Lungs:   clear to auscultation bilaterally, Respirations unlabored, no coughing  Heart:  regular rate and rhythm and no murmur, Appears well perfused  Extremities: No edema  Skin: Skin color, texture, turgor normal,  eczematous patches periorbitally and on left antecubital fossa  Neurologic: No gross deficits   The plan was reviewed with the patient/family, and all questions/concerned were addressed.  It was my pleasure to see Miette today and participate in her care. Please feel free to contact me with any questions or concerns.  Sincerely,  Roney Marion, MD Allergy & Immunology  [facility] office: [phone] [facility] office: [phone]

## 2022-09-22 NOTE — Telephone Encounter (Signed)
Can we do a PA? 

## 2022-09-22 NOTE — Patient Instructions (Addendum)
Atopic Dermatitis: not well controlled  Daily Care For Maintenance (daily and continue even once eczema controlled) - Recommend hypoallergenic hydrating ointment at least twice daily.  This must be done daily for control of flares. (Great options include Vaseline, CeraVe, Aquaphor, Aveeno, Cetaphil, VaniCream, etc) - Recommend avoiding detergents, soaps or lotions with fragrances/dyes, and instead using products which are hypoallergenic, use second rinse cycle when washing clothes -Wear lose breathable clothing, avoid wool -Avoid extremes of humidity - Limit showers/baths to 5 minutes and use luke warm water instead of hot, pat dry following baths, and apply moisturizer - START Eucrisa twice daily  - can use steroid creams as detailed below up to twice weekly for prevention of flares.  For Flares:(add this to maintenance therapy if needed for flares) - Hydrocortisone 2.5% to face, armpit or groin-apply topically twice daily to red, raised areas of skin, followed by moisturizer -Can use with Eucrisa   Seasonal and Perennial  Rhinitis not well controlled : - allergy testing today was skin prick positive to grass pollen, weed pollen, tree pollen, mold, dust mite Intradermal testing positive to cat, dog, roach - allergen avoidance as below - consider allergy shots as long term control of your symptoms by teaching your immune system to be more tolerant of your allergy triggers - STOP allegra -D (the D portion is not good to take long term)  - Start Nasal Steroid Spray: Options include Flonase (fluticasone), Nasocort (triamcinolone), Nasonex (mometasome) 1- 2 sprays in each nostril daily (can buy over-the-counter if not covered by insurance)  Best results if used daily. - Start Astelin (Azelastine) 1-2 sprays in each nostril twice a day as needed.  You may use this as needed for nasal congestion/itchy ears/itchy nose if desired - Start Singulair (Montelukast) 10mg  nightly. - Continue over the  counter antihistamine daily or daily as needed.   -Your options include Zyrtec (Cetirizine) 10mg , Claritin (Loratadine) 10mg , Allegra (Fexofenadine) 180mg , or Xyzal (Levocetirinze) 5mg   Allergic Conjunctivitis:  - Consider Allergy Eye drops: great options include Pataday (Olopatadine) or Zaditor (ketotifen) for eye symptoms daily as needed-both sold over the counter if not covered by insurance.   -Avoid eye drops that say red eye relief as they may contain medications that dry out your eyes.  Food allergy:  - today's skin testing was positive to hazelnut and pistachio  - please strictly avoid tree nuts  - okay to resume eating seafood, dairy - for SKIN only reaction, okay to take Benadryl 2 capsules every 6 hours - for SKIN + ANY additional symptoms, OR IF concern for LIFE THREATENING reaction = Epipen Autoinjector EpiPen 0.3 mg. - If using Epinephrine autoinjector, call 911 - A food allergy action plan has been provided and discussed. - Medic Alert identification is recommended.  Follow up: 4 weeks   Thank you so much for letting me partake in your care today.  Don't hesitate to reach out if you have any additional concerns!  , MD  [facility] [place], [place]   Reducing Pollen Exposure  The American Academy of Allergy, Asthma and Immunology suggests the following steps to reduce your exposure to pollen during allergy seasons.    Do not hang sheets or clothing out to dry; pollen may collect on these items. Do not mow lawns or spend time around freshly cut grass; mowing stirs up pollen. Keep windows closed at night.  Keep car windows closed while driving. Minimize morning activities outdoors, a time when pollen counts are usually  at their highest. Stay indoors as much as possible when pollen counts or humidity is high and on windy days when pollen tends to remain in the air longer. Use air conditioning when possible.  Many air conditioners have filters  that trap the pollen spores. Use a HEPA room air filter to remove pollen form the indoor air you breathe.  DUST MITE AVOIDANCE MEASURES:  There are three main measures that need and can be taken to avoid house dust mites:  Reduce accumulation of dust in general -reduce furniture, clothing, carpeting, books, stuffed animals, especially in bedroom  Separate yourself from the dust -use pillow and mattress encasements (can be found at stores such as [employer] or online) -avoid direct exposure to air condition flow -use a HEPA filter device, especially in the bedroom; you can also use a HEPA filter vacuum cleaner -wipe dust with a moist towel instead of a dry towel or broom when cleaning  Decrease mites and/or their secretions -wash clothing and linen and stuffed animals at highest temperature possible, at least every 2 weeks -stuffed animals can also be placed in a bag and put in a freezer overnight  Despite the above measures, it is impossible to eliminate dust mites or their allergen completely from your home.  With the above measures the burden of mites in your home can be diminished, with the goal of minimizing your allergic symptoms.  Success will be reached only when implementing and using all means together.  Control of Cockroach Allergen  Cockroach allergen has been identified as an important cause of acute attacks of asthma, especially in urban settings.  There are fifty-five species of cockroach that exist in the [place], however only three, the [place], [place] species produce allergen that can affect patients with Asthma.  Allergens can be obtained from fecal particles, egg casings and secretions from cockroaches.    Remove food sources. Reduce access to water. Seal access and entry points. Spray runways with 0.5-1% Diazinon or Chlorpyrifos Blow boric acid power under stoves and refrigerator. Place bait stations (hydramethylnon) at feeding  sites.  Control of Dog or Cat Allergen  Avoidance is the best way to manage a dog or cat allergy. If you have a dog or cat and are allergic to dog or cats, consider removing the dog or cat from the home. If you have a dog or cat but don't want to find it a new home, or if your family wants a pet even though someone in the household is allergic, here are some strategies that may help keep symptoms at bay:  Keep the pet out of your bedroom and restrict it to only a few rooms. Be advised that keeping the dog or cat in only one room will not limit the allergens to that room. Don't pet, hug or kiss the dog or cat; if you do, wash your hands with soap and water. High-efficiency particulate air (HEPA) cleaners run continuously in a bedroom or living room can reduce allergen levels over time. Regular use of a high-efficiency vacuum cleaner or a central vacuum can reduce allergen levels. Giving your dog or cat a bath at least once a week can reduce airborne allergen.  Control of Mold Allergen   Mold and fungi can grow on a variety of surfaces provided certain temperature and moisture conditions exist.  Outdoor molds grow on plants, decaying vegetation and soil.  The major outdoor mold, Alternaria and Cladosporium, are found in very high numbers  during hot and dry conditions.  Generally, a late Summer - Fall peak is seen for common outdoor fungal spores.  Rain will temporarily lower outdoor mold spore count, but counts rise rapidly when the rainy period ends.  The most important indoor molds are Aspergillus and Penicillium.  Dark, humid and poorly ventilated basements are ideal sites for mold growth.  The next most common sites of mold growth are the bathroom and the kitchen.  Outdoor (Seasonal) Mold Control  Positive outdoor molds via skin testing: Cladosporium  Use air conditioning and keep windows closed Avoid exposure to decaying vegetation. Avoid leaf raking. Avoid grain handling. Consider  wearing a face mask if working in moldy areas.

## 2022-10-15 ENCOUNTER — Other Ambulatory Visit (HOSPITAL_COMMUNITY): Payer: Self-pay

## 2022-10-15 ENCOUNTER — Telehealth: Payer: Self-pay

## 2022-10-15 NOTE — Telephone Encounter (Signed)
Patient Advocate Encounter  Received a request from [employer] for a prior authorization for EPINEPHrine 0.3MG /0.3ML auto-injectors.  This medication does not need a prior authorization at this time.

## 2022-10-21 ENCOUNTER — Ambulatory Visit: Payer: Managed Care, Other (non HMO) | Admitting: Internal Medicine

## 2023-04-15 ENCOUNTER — Other Ambulatory Visit: Payer: Self-pay | Admitting: Internal Medicine

## 2023-10-15 ENCOUNTER — Other Ambulatory Visit: Payer: Self-pay | Admitting: Internal Medicine

## 2023-11-22 ENCOUNTER — Ambulatory Visit: Payer: Managed Care, Other (non HMO) | Admitting: Internal Medicine

## 2023-11-22 ENCOUNTER — Encounter: Payer: Self-pay | Admitting: Internal Medicine

## 2023-11-22 VITALS — BP 120/74 | HR 66 | Temp 96.8°F | Resp 16 | Ht 66.5 in | Wt 265.3 lb

## 2023-11-22 DIAGNOSIS — J3089 Other allergic rhinitis: Secondary | ICD-10-CM

## 2023-11-22 DIAGNOSIS — H1045 Other chronic allergic conjunctivitis: Secondary | ICD-10-CM

## 2023-11-22 DIAGNOSIS — L2084 Intrinsic (allergic) eczema: Secondary | ICD-10-CM

## 2023-11-22 DIAGNOSIS — L2389 Allergic contact dermatitis due to other agents: Secondary | ICD-10-CM

## 2023-11-22 DIAGNOSIS — J302 Other seasonal allergic rhinitis: Secondary | ICD-10-CM

## 2023-11-22 MED ORDER — HYDROCORTISONE 2.5 % EX CREA: TOPICAL_CREAM | Freq: Two times a day (BID) | CUTANEOUS | 1 refills | Status: DC

## 2023-11-22 MED ORDER — MONTELUKAST SODIUM 10 MG PO TABS: 10.0000 mg | ORAL_TABLET | Freq: Every day | ORAL | 1 refills | Status: DC

## 2023-11-22 MED ORDER — TACROLIMUS 0.1 % EX OINT: TOPICAL_OINTMENT | Freq: Two times a day (BID) | CUTANEOUS | 5 refills | Status: AC

## 2023-11-22 NOTE — Patient Instructions (Addendum)
Atopic Dermatitis: not well controlled, BSA 10%  - Concern for comorbid contact dermatitis   - TRUE TEST patch test applied today  - Information on dupixent provided today, will reach out to Tammy about potential costs    Daily Care For Maintenance (daily and continue even once eczema controlled) - Recommend hypoallergenic hydrating ointment at least twice daily.  This must be done daily for control of flares. (Great options include Vaseline, CeraVe, Aquaphor, Aveeno, Cetaphil, VaniCream, etc) - Recommend avoiding detergents, soaps or lotions with fragrances/dyes, and instead using products which are hypoallergenic, use second rinse cycle when washing clothes -Wear lose breathable clothing, avoid wool -Avoid extremes of humidity - Limit showers/baths to 5 minutes and use luke warm water instead of hot, pat dry following baths, and apply moisturizer - can use steroid creams as detailed below up to twice weekly for prevention of flares.  For Flares:(add this to maintenance therapy if needed for flares) - Hydrocortisone 2.5% to face, armpit or groin-apply topically twice daily to red, raised areas of skin, followed by moisturizer - Protopic 0.1% ointment twice daily (refrigerate)   Seasonal and Perennial  Rhinitis:  - allergy testing (09/22/22) positive to grass pollen, weed pollen, tree pollen, mold, dust mite, cat, dog, roach - allergen avoidance as below - consider allergy shots as long term control of your symptoms by teaching your immune system to be more tolerant of your allergy triggers - Continue Nasal Steroid Spray: Options include Flonase (fluticasone), Nasocort (triamcinolone), Nasonex (mometasome) 1- 2 sprays in each nostril daily (can buy over-the-counter if not covered by insurance)  Best results if used daily. - Continue Singulair (Montelukast) 10mg  nightly. - Continue over the counter antihistamine daily or daily as needed.   -Your options include Zyrtec (Cetirizine) 10mg ,  Claritin (Loratadine) 10mg , Allegra (Fexofenadine) 180mg , or Xyzal (Levocetirinze) 5mg   Allergic Conjunctivitis:  - Consider Allergy Eye drops: great options include Pataday (Olopatadine) or Zaditor (ketotifen) for eye symptoms daily as needed-both sold over the counter if not covered by insurance.   -Avoid eye drops that say red eye relief as they may contain medications that dry out your eyes.  Food allergy:  - Allergy test (09/22/22): positive to hazelnut and pistachio  - please strictly avoid tree nuts  - for SKIN only reaction, okay to take Benadryl 2 capsules every 6 hours - for SKIN + ANY additional symptoms, OR IF concern for LIFE THREATENING reaction = Epipen Autoinjector EpiPen 0.3 mg. - If using Epinephrine autoinjector, call 911 - A food allergy action plan has been provided and discussed. - Medic Alert identification is recommended.  Follow up: Wednesday and Friday for Patch testing   Thank you so much for letting me partake in your care today.  Don't hesitate to reach out if you have any additional concerns!  Ferol Luz, MD  [facility] [place], [place]

## 2023-11-22 NOTE — Progress Notes (Unsigned)
FOLLOW UP Date of Service/Encounter:  11/22/23  Subjective:  Kerry Smith (DOB: [birth year]) is a 33 y.o. female who [facility] in re-evaluation of the following:  History obtained from: chart review and patient.  For Review, LV was on 09/22/24  with Dr. Marlynn Smith seen for routine follow-up. See below for summary of history and diagnostics.   Therapeutic plans/changes recommended: Eucrisa, flonase, astelin and singulair starting.  ----------------------------------------------------- Pertinent History/Diagnostics:  Allergic Rhinitis:  RX: allegra, montelukast flonase  Perennial, nasal and ocular symptoms, eyelid dermatitis, worsens at work  - SPT environmental panel (09/22/22): grass pollen, weed pollen, tree pollen, mold, dust mite, cat, dog, roach Food Allergy:  Avoids tree nut from testing, citrus causes mouth itching  - SPT select foods (09/22/22): hazelnut, pistachio Eczema: Flares eyelids and creases of elbows RX: hydrocortisone, eucrisa   --------------------------------------------------- Today presents for follow-up. Discussed the use of AI scribe software for clinical note transcription with the patient, who gave verbal consent to proceed.  History of Present Illness   Kerry Smith, a patient with a history of skin issues, presents with worsening symptoms, particularly around the eye area. She reports an increase in itchiness and discomfort, despite using Aveeno lotion and prescribed clindamycin cream. She denies using any anti-inflammatory or steroid creams. She also expresses concern about potential allergens in personal care products, including hair gel, shampoo, and even the material of her Apple Watch.  She has previously undergone food allergy testing, which returned positive to tree nuts.  She continues to avoid and denies any accidental ingestion or reactions. However, environmental allergy testing revealed positive reactions to  grass, weed, tree, mold, dust mite, cat, dog, and roach. She also reports a reaction to deodorant, resulting in itchiness.  In addition to the skin issues, she has eczema on the neck and has noticed creases. She expresses frustration with the persistent skin issues, particularly around the eyes, which have been affecting her quality of life.  She is currently using Allegra, montelukast, and Flonase for nasal symptoms, which appear to be well controlled. She also avoids tree nuts due to a known allergy but has successfully reintroduced dairy and seafood into her diet.  She has previously been prescribed pimecrolimus for the eye area, but reports minimal improvement after a few months of use.         All medications reviewed by clinical staff and updated in chart. No new pertinent medical or surgical history except as noted in HPI.  ROS: All others negative except as noted per HPI.   Objective:  BP 120/74   Pulse 66   Temp (!) 96.8 F (36 C) (Temporal)   Resp 16   Ht 5' 6.5" (1.689 m)   Wt 265 lb 4.8 oz (120.3 kg)   SpO2 99%   BMI 42.18 kg/m  Body mass index is 42.18 kg/m. Physical Exam: General Appearance:  Alert, cooperative, no distress, appears stated age  Head:  Normocephalic, without obvious abnormality, atraumatic  Eyes:  Conjunctiva clear, EOM's intact  Ears {Blank multiple:19196:a:"***","EACs normal bilaterally","normal TMs bilaterally","ear tubes present bilaterally without exudate"}  Nose: Nares normal, {Blank multiple:19196:a:"***","hypertrophic turbinates","normal mucosa","no visible anterior polyps","septum midline"}  Throat: Lips, tongue normal; teeth and gums normal, {Blank multiple:19196:a:"***","normal posterior oropharynx","tonsils 2+","tonsils 3+","no tonsillar exudate","+ cobblestoning","surgically absent tonsils"}  Neck: Supple, symmetrical  Lungs:   {Blank multiple:19196:a:"***","clear to auscultation bilaterally","end-expiratory wheezing","wheezing  throughout"}, Respirations unlabored, {Blank multiple:19196:a:"***","no coughing","intermittent dry coughing"}  Heart:  {Blank multiple:19196:a:"***","regular rate and rhythm","no murmur"}, Appears well perfused  Extremities: No edema  Skin: {Blank multiple:19196:a:"***","erythematous, dry patches scattered on ***","lichenification on ***","Skin color, texture, turgor normal","no rashes or lesions on visualized portions of skin"}  Neurologic: No gross deficits   Labs:  Lab Orders  No laboratory test(s) ordered today    Spirometry:  Tracings reviewed. Her effort: {Blank single:19197::"Good reproducible efforts.","It was hard to get consistent efforts and there is a question as to whether this reflects a maximal maneuver.","Poor effort, data can not be interpreted.","Variable effort-results affected","effort okay for first attempt at spirometry.","Results not reproducible due to ***"} FVC: ***L FEV1: ***L, ***% predicted FEV1/FVC ratio: ***% Interpretation: {Blank single:19197::"Spirometry consistent with mild obstructive disease","Spirometry consistent with moderate obstructive disease","Spirometry consistent with severe obstructive disease","Spirometry consistent with possible restrictive disease","Spirometry consistent with mixed obstructive and restrictive disease","Spirometry uninterpretable due to technique","Spirometry consistent with normal pattern","No overt abnormalities noted given today's efforts","Nonobstructive ratio, low FEV1","Nonobstructive ratio, low FEV1, possible restriction"}.  Please see scanned spirometry results for details.  Skin Testing: {Blank single:19197::"Select foods","Environmental allergy panel","Environmental allergy panel and select foods","Food allergy panel","None","Deferred due to recent antihistamines use","deferred due to recent reaction","Pediatric Environmental Allergy Panel","Pediatric Food Panel","Select foods and environmental allergies"}. {Blank  single:19197::"Adequate positive and negative controls","Inadequate positive control-testing invalid","Adequate positive and negative controls, dermatographism present, testing difficult to interpret"}. Results discussed with patient/family.  T.R.U.E. Test - 11/22/23 1103       Test Information   Time Antigen Placed 1103    Manufacturer Greer    Location Back    Number of Test 36    Reading Interval Day 1;Day 3;Day 5    Panel Panel 3;Panel 2;Panel 1             {Blank single:19197::"Allergy testing results were read and interpreted by myself, documented by clinical staff.","Allergy testing results were read by ***,FNP, documented by clinical staff"}  Assessment/Plan   ***  Other: {Blank multiple:19196:a:"***","samples provided of: ***","spacer provided in clinic","nebulizer machine provided in clinic","school forms provided","reviewed spirometry technique","reviewed inhaler technique","allergy injection given in clinic today","biologic given in clinic today"}  Thank you so much for letting me partake in your care today.  Don't hesitate to reach out if you have any additional concerns!  Ferol Luz, MD  [facility] [place], [place]

## 2023-11-24 ENCOUNTER — Ambulatory Visit (INDEPENDENT_AMBULATORY_CARE_PROVIDER_SITE_OTHER): Payer: Managed Care, Other (non HMO) | Admitting: Internal Medicine

## 2023-11-24 DIAGNOSIS — L2389 Allergic contact dermatitis due to other agents: Secondary | ICD-10-CM | POA: Diagnosis not present

## 2023-11-24 NOTE — Progress Notes (Signed)
   Follow Up Note  RE: Kerry Smith MRN: [id number] DOB: 01-Jun-1991 Date of Office Visit: 11/24/2023  Referring provider: Darryl Lent, PA-C Primary care provider: Darryl Lent, PA-C  History of Present Illness: I had the pleasure of seeing Kerry Smith for a follow up visit at the [facility] on 11/24/2023. She is a 33 y.o. female, who is being followed for contact dermatitis . Today she is here for initial patch test interpretation, given suspected history of contact dermatitis.   Diagnostics:  TRUE TEST 48 hour reading:   T.R.U.E. Test - 11/24/23 1100       Test Information   Time Antigen Placed 1103    Manufacturer Greer    Location Back    Number of Test 36    Reading Interval Day 1;Day 3;Day 5    Panel Panel 3;Panel 2;Panel 1      Panel 1   1. Nickel Sulfate 0    2. Wool Alcohols 0    3. Neomycin Sulfate 0    4. Potassium Dichromate 0    5. Caine Mix 0    6. Fragrance Mix 0    7. Colophony 0    8. Paraben Mix 0    9. Negative Control 0    10. Balsam of [place] 0    11. Ethylenediamine Dihydrochloride 0    12. Cobalt Dichloride 0      Panel 2   13. p-tert Butylphenol Formaldehyde Resin 0    14. Epoxy Resin 0    15. Carba Mix 0    16.  Black Rubber Mix 0    17. Cl+ Me-Isothiazolinone 0    18. Quaternium-15 0    19. Methyldibromo Glutaronitrile 0    20. p-Phenylenediamine 0    21. Formaldehyde 0    22. Mercapto Mix 0    23. Thimerosal 3    24. Thiuram Mix 0      Panel 3   25. Diazolidinyl Urea 0    26. Quinoline Mix 0    27. Tixocortol-21-Pivalate 0    28. Gold Sodium Thiosulfate 0    29. Imidazolidinyl Urea 0    30. Budesonide 0    31. Hydrocortisone-17-Butyrate 0    32. Mercaptobenzothiazole 0    33. Bacitracin 0    34. Parthenolide 0    35. Disperse Blue 106 0    36. 2-Bromo-2-Nitropropane-1,3-diol 0              Assessment and Plan: Kerry Smith is a 33 y.o. female with: Concern for Contact Dermatitis:  The patient has been  provided detailed information regarding the substances she is sensitive to, as well as products containing the substances.  Meticulous avoidance of these substances is recommended. If avoidance is not possible, the use of barrier creams or lotions is recommended. If symptoms persist or progress despite meticulous avoidance of chemicals/substances above, dermatology evaluation may be warranted. No follow-ups on file.  It was my pleasure to see Kerry Smith today and participate in her care. Please feel free to contact me with any questions or concerns.  Sincerely,   Ferol Luz, MD [facility]

## 2023-11-26 ENCOUNTER — Encounter: Payer: Self-pay | Admitting: Family

## 2023-11-26 ENCOUNTER — Ambulatory Visit (INDEPENDENT_AMBULATORY_CARE_PROVIDER_SITE_OTHER): Payer: Managed Care, Other (non HMO) | Admitting: Family

## 2023-11-26 DIAGNOSIS — L2389 Allergic contact dermatitis due to other agents: Secondary | ICD-10-CM

## 2023-11-26 NOTE — Progress Notes (Signed)
Jadea returns to the office today for the final patch test interpretation, given suspected history of contact dermatitis.    Diagnostics:   TRUE TEST 96-hour hour reading: +++ ( extreme positive reaction) thimerosal   Plan:   Allergic contact dermatitis - The patient has been provided detailed information regarding the substances she is sensitive to, as well as products containing the substances.   - Meticulous avoidance of these substances is recommended.  - If avoidance is not possible, the use of barrier creams or lotions is recommended. - If symptoms persist or progress despite meticulous avoidance of thimerosal, Dermatology Referral may be warranted.   Nehemiah Settle, FNP [facility] [place]

## 2023-12-02 ENCOUNTER — Telehealth: Payer: Self-pay

## 2023-12-02 NOTE — Telephone Encounter (Signed)
Patient called to inquire about dupixent that Dr Marlynn Perking had ordered at her last visit, she has not heard back from [place] or insurance, she is interested in home/ self administration. Please advice. I provided Tammy number.

## 2023-12-05 ENCOUNTER — Telehealth: Payer: Self-pay | Admitting: *Deleted

## 2023-12-05 NOTE — Telephone Encounter (Signed)
Ins has denied approval for Dupixent due to patient not having trial and failure to medium or high potency topical corticosteroid      Kerry Gobble, MD  Kerry Smith, CMA Kerry Smith I would like to start this patient on dupixent 300mg  every 2 weeks for AD.  Do we know what that cost would be for her?

## 2023-12-06 NOTE — Telephone Encounter (Signed)
LM for pt to call us back about starting triamcinolone

## 2023-12-09 MED ORDER — TRIAMCINOLONE ACETONIDE 0.1 % EX OINT: TOPICAL_OINTMENT | CUTANEOUS | 0 refills | Status: DC

## 2023-12-09 MED ORDER — CLOBETASOL PROPIONATE 0.05 % EX OINT: TOPICAL_OINTMENT | CUTANEOUS | 0 refills | Status: DC

## 2023-12-09 NOTE — Telephone Encounter (Signed)
 Sent in medications left detailed message for pt per dpr. Explaining the denial by insurance of dupixent and to try the triamcinolone  for milk flares or clobetasol  for sever flares and if she does not have relief of eczema after a month with these to let us  know and we will try again for dupixent.

## 2023-12-09 NOTE — Addendum Note (Signed)
 Addended by: Belva Boyden on: 12/09/2023 03:02 PM   Modules accepted: Orders

## 2023-12-10 MED ORDER — EUCRISA 2 % EX OINT: 1.0000 | TOPICAL_OINTMENT | Freq: Two times a day (BID) | CUTANEOUS | 1 refills | Status: AC

## 2023-12-10 NOTE — Telephone Encounter (Signed)
 She has hydrocortisone  and eurcisa, she can use that on her face

## 2023-12-10 NOTE — Telephone Encounter (Signed)
 Informed pt of this and sent in rx for eucrisa  as she is out she only has eczema on her face and will try these for a month and give us  an update.

## 2023-12-10 NOTE — Telephone Encounter (Signed)
 Pt has a break out under eyes due to triamcinolone  and clobetasol  not being used on face can we send in something to try on face?

## 2023-12-10 NOTE — Addendum Note (Signed)
 Addended by: Belva Boyden on: 12/10/2023 01:56 PM   Modules accepted: Orders

## 2023-12-14 ENCOUNTER — Encounter: Payer: Self-pay | Admitting: Internal Medicine

## 2023-12-15 NOTE — Telephone Encounter (Signed)
Patient received approval for Dupixent by mail so I will submit her to [facility], Instructed on delivery, storage, dosing and initial injection in clinic for admin instructions

## 2024-01-20 ENCOUNTER — Telehealth: Payer: Self-pay | Admitting: *Deleted

## 2024-01-20 NOTE — Telephone Encounter (Signed)
 L/m for patient with numbers to reach out to [employer] and then [facility] to get dupxient shipped

## 2024-01-21 ENCOUNTER — Ambulatory Visit: Payer: Managed Care, Other (non HMO) | Admitting: Family

## 2024-02-14 ENCOUNTER — Other Ambulatory Visit: Payer: Self-pay | Admitting: Internal Medicine

## 2024-04-04 ENCOUNTER — Emergency Department (HOSPITAL_BASED_OUTPATIENT_CLINIC_OR_DEPARTMENT_OTHER)

## 2024-04-04 ENCOUNTER — Other Ambulatory Visit: Payer: Self-pay

## 2024-04-04 ENCOUNTER — Observation Stay (HOSPITAL_BASED_OUTPATIENT_CLINIC_OR_DEPARTMENT_OTHER)
Admission: EM | Admit: 2024-04-04 | Discharge: 2024-04-05 | Disposition: A | Discharge status: Home / Self Care | Attending: General Surgery | Admitting: General Surgery

## 2024-04-04 ENCOUNTER — Encounter (HOSPITAL_BASED_OUTPATIENT_CLINIC_OR_DEPARTMENT_OTHER): Payer: Self-pay

## 2024-04-04 DIAGNOSIS — K3580 Unspecified acute appendicitis: Secondary | ICD-10-CM | POA: Diagnosis present

## 2024-04-04 DIAGNOSIS — J45909 Unspecified asthma, uncomplicated: Secondary | ICD-10-CM | POA: Insufficient documentation

## 2024-04-04 DIAGNOSIS — I10 Essential (primary) hypertension: Secondary | ICD-10-CM | POA: Diagnosis not present

## 2024-04-04 DIAGNOSIS — R110 Nausea: Secondary | ICD-10-CM | POA: Diagnosis not present

## 2024-04-04 DIAGNOSIS — Z79899 Other long term (current) drug therapy: Secondary | ICD-10-CM | POA: Insufficient documentation

## 2024-04-04 DIAGNOSIS — Z789 Other specified health status: Secondary | ICD-10-CM | POA: Diagnosis not present

## 2024-04-04 DIAGNOSIS — R1084 Generalized abdominal pain: Secondary | ICD-10-CM | POA: Diagnosis not present

## 2024-04-04 DIAGNOSIS — R109 Unspecified abdominal pain: Secondary | ICD-10-CM | POA: Diagnosis present

## 2024-04-04 LAB — URINALYSIS, ROUTINE W REFLEX MICROSCOPIC
Bilirubin Urine: NEGATIVE
Glucose, UA: NEGATIVE mg/dL
Hgb urine dipstick: NEGATIVE
Ketones, ur: NEGATIVE mg/dL
Leukocytes,Ua: NEGATIVE
Nitrite: NEGATIVE
Protein, ur: NEGATIVE mg/dL
Specific Gravity, Urine: 1.015 (ref 1.005–1.030)
pH: 6 (ref 5.0–8.0)

## 2024-04-04 LAB — LIPASE, BLOOD: Lipase: 16 U/L (ref 11–51)

## 2024-04-04 LAB — CBC WITH DIFFERENTIAL/PLATELET
Abs Immature Granulocytes: 0.02 10*3/uL (ref 0.00–0.07)
Basophils Absolute: 0 10*3/uL (ref 0.0–0.1)
Basophils Relative: 0 %
Eosinophils Absolute: 0.2 10*3/uL (ref 0.0–0.5)
Eosinophils Relative: 3 %
HCT: 37.9 % (ref 36.0–46.0)
Hemoglobin: 12.5 g/dL (ref 12.0–15.0)
Immature Granulocytes: 0 %
Lymphocytes Relative: 32 %
Lymphs Abs: 2.2 10*3/uL (ref 0.7–4.0)
MCH: 28 pg (ref 26.0–34.0)
MCHC: 33 g/dL (ref 30.0–36.0)
MCV: 85 fL (ref 80.0–100.0)
Monocytes Absolute: 0.4 10*3/uL (ref 0.1–1.0)
Monocytes Relative: 6 %
Neutro Abs: 3.9 10*3/uL (ref 1.7–7.7)
Neutrophils Relative %: 59 %
Platelets: 274 10*3/uL (ref 150–400)
RBC: 4.46 MIL/uL (ref 3.87–5.11)
RDW: 14.4 % (ref 11.5–15.5)
WBC: 6.8 10*3/uL (ref 4.0–10.5)
nRBC: 0 % (ref 0.0–0.2)

## 2024-04-04 LAB — COMPREHENSIVE METABOLIC PANEL WITH GFR
ALT: 16 U/L (ref 0–44)
AST: 27 U/L (ref 15–41)
Albumin: 4.2 g/dL (ref 3.5–5.0)
Alkaline Phosphatase: 87 U/L (ref 38–126)
Anion gap: 11 (ref 5–15)
BUN: 10 mg/dL (ref 6–20)
CO2: 24 mmol/L (ref 22–32)
Calcium: 9.2 mg/dL (ref 8.9–10.3)
Chloride: 104 mmol/L (ref 98–111)
Creatinine, Ser: 0.78 mg/dL (ref 0.44–1.00)
GFR, Estimated: >60 mL/min (ref >60)
Glucose, Bld: 82 mg/dL (ref 70–99)
Potassium: 4 mmol/L (ref 3.5–5.1)
Sodium: 139 mmol/L (ref 135–145)
Total Bilirubin: 0.6 mg/dL (ref 0.0–1.2)
Total Protein: 7.4 g/dL (ref 6.5–8.1)

## 2024-04-04 LAB — PREGNANCY, URINE: Preg Test, Ur: NEGATIVE

## 2024-04-04 LAB — SURGICAL PCR SCREEN
MRSA, PCR: NEGATIVE
Staphylococcus aureus: POSITIVE — AB

## 2024-04-04 LAB — HIV ANTIBODY (ROUTINE TESTING W REFLEX): HIV Screen 4th Generation wRfx: NONREACTIVE

## 2024-04-04 MED ORDER — ORAL CARE MOUTH RINSE: 15.0000 mL | OROMUCOSAL | Status: DC | PRN

## 2024-04-04 MED ORDER — FENTANYL CITRATE PF 50 MCG/ML IJ SOSY
50.0000 ug | PREFILLED_SYRINGE | Freq: Once | INTRAMUSCULAR | Status: AC
  Administered 2024-04-04: 50 ug via INTRAVENOUS
  Filled 2024-04-04: qty 1

## 2024-04-04 MED ORDER — ONDANSETRON HCL 4 MG/2ML IJ SOLN
4.0000 mg | Freq: Once | INTRAMUSCULAR | Status: AC
  Administered 2024-04-04: 4 mg via INTRAVENOUS
  Filled 2024-04-04: qty 2

## 2024-04-04 MED ORDER — METRONIDAZOLE 500 MG/100ML IV SOLN: 500.0000 mg | Freq: Once | INTRAVENOUS | Status: DC

## 2024-04-04 MED ORDER — IOHEXOL 300 MG/ML  SOLN
100.0000 mL | Freq: Once | INTRAMUSCULAR | Status: AC | PRN
  Administered 2024-04-04: 100 mL via INTRAVENOUS

## 2024-04-04 MED ORDER — SPIRONOLACTONE 25 MG PO TABS: 50.0000 mg | ORAL_TABLET | Freq: Every day | ORAL | Status: DC

## 2024-04-04 MED ORDER — ALUM & MAG HYDROXIDE-SIMETH 200-200-20 MG/5ML PO SUSP
30.0000 mL | Freq: Once | ORAL | Status: AC
  Administered 2024-04-04: 30 mL via ORAL
  Filled 2024-04-04: qty 30

## 2024-04-04 MED ORDER — ENOXAPARIN SODIUM 40 MG/0.4ML IJ SOSY: 40.0000 mg | PREFILLED_SYRINGE | INTRAMUSCULAR | Status: DC

## 2024-04-04 MED ORDER — METRONIDAZOLE 500 MG/100ML IV SOLN
500.0000 mg | Freq: Two times a day (BID) | INTRAVENOUS | Status: DC
  Administered 2024-04-04 – 2024-04-05 (×2): 500 mg via INTRAVENOUS
  Filled 2024-04-04 (×2): qty 100

## 2024-04-04 MED ORDER — HYDROMORPHONE HCL 1 MG/ML IJ SOLN
1.0000 mg | INTRAMUSCULAR | Status: DC | PRN
  Administered 2024-04-05: 1 mg via INTRAVENOUS
  Filled 2024-04-04: qty 1

## 2024-04-04 MED ORDER — MONTELUKAST SODIUM 10 MG PO TABS: 10.0000 mg | ORAL_TABLET | Freq: Every day | ORAL | Status: DC

## 2024-04-04 MED ORDER — ONDANSETRON HCL 4 MG/2ML IJ SOLN: 4.0000 mg | INTRAMUSCULAR | Status: DC | PRN

## 2024-04-04 MED ORDER — FLUTICASONE PROPIONATE 50 MCG/ACT NA SUSP: 2.0000 | Freq: Every day | NASAL | Status: DC | PRN

## 2024-04-04 MED ORDER — CIPROFLOXACIN IN D5W 400 MG/200ML IV SOLN
400.0000 mg | Freq: Once | INTRAVENOUS | Status: AC
  Administered 2024-04-04: 400 mg via INTRAVENOUS
  Filled 2024-04-04: qty 200

## 2024-04-04 MED ORDER — CIPROFLOXACIN IN D5W 400 MG/200ML IV SOLN
400.0000 mg | Freq: Two times a day (BID) | INTRAVENOUS | Status: DC
  Administered 2024-04-05: 400 mg via INTRAVENOUS
  Filled 2024-04-04: qty 200

## 2024-04-04 MED ORDER — CRISABOROLE 2 % EX OINT: 1.0000 | TOPICAL_OINTMENT | Freq: Two times a day (BID) | CUTANEOUS | Status: DC

## 2024-04-04 MED ORDER — TACROLIMUS 0.1 % EX OINT: TOPICAL_OINTMENT | Freq: Two times a day (BID) | CUTANEOUS | Status: DC

## 2024-04-04 MED ORDER — KCL IN DEXTROSE-NACL 20-5-0.9 MEQ/L-%-% IV SOLN
INTRAVENOUS | Status: DC
  Filled 2024-04-04 (×2): qty 1000

## 2024-04-04 MED ORDER — PANTOPRAZOLE SODIUM 40 MG IV SOLR
40.0000 mg | Freq: Once | INTRAVENOUS | Status: AC
  Administered 2024-04-04: 40 mg via INTRAVENOUS
  Filled 2024-04-04: qty 10

## 2024-04-04 MED ORDER — MUPIROCIN 2 % EX OINT
1.0000 | TOPICAL_OINTMENT | Freq: Two times a day (BID) | CUTANEOUS | Status: DC
  Administered 2024-04-04: 1 via NASAL
  Filled 2024-04-04: qty 22

## 2024-04-04 MED ORDER — KETOROLAC TROMETHAMINE 15 MG/ML IJ SOLN
15.0000 mg | Freq: Once | INTRAMUSCULAR | Status: AC
  Administered 2024-04-04: 15 mg via INTRAVENOUS
  Filled 2024-04-04: qty 1

## 2024-04-04 MED ORDER — LORATADINE 10 MG PO TABS
10.0000 mg | ORAL_TABLET | Freq: Every day | ORAL | Status: DC
  Administered 2024-04-04: 10 mg via ORAL
  Filled 2024-04-04: qty 1

## 2024-04-04 MED ORDER — SODIUM CHLORIDE 0.9 % IV SOLN: Freq: Once | INTRAVENOUS | Status: AC

## 2024-04-04 MED ORDER — SODIUM CHLORIDE 0.9 % IV BOLUS
1000.0000 mL | Freq: Once | INTRAVENOUS | Status: AC
  Administered 2024-04-04: 1000 mL via INTRAVENOUS

## 2024-04-04 NOTE — ED Notes (Signed)
 Pt transported to imaging.

## 2024-04-04 NOTE — ED Notes (Signed)
 Pt provided microwave meal and ginger ale per her request.  EDP aware and agreeable.

## 2024-04-04 NOTE — ED Notes (Signed)
 [facility] at bedside

## 2024-04-04 NOTE — ED Provider Notes (Signed)
  Physical Exam  BP 112/70   Pulse 61   Temp 98.1 F (36.7 C)   Resp 17   Ht 5\' 6"  (1.676 m)   Wt 116.6 kg   LMP 03/30/2024 (Exact Date)   SpO2 100%   BMI 41.48 kg/m   Physical Exam  Procedures  Procedures  ED Course / MDM   Clinical Course as of 04/04/24 1523  Tue Apr 04, 2024  1443 Ct w/ uncomplicated appendicitis, start abx (amox allergy ) will d/w gen surg [SG]  1517 Spoke w/ gen surg, will call back with OR availability  [SG]    Clinical Course User Index [SG] Teddi Favors, DO   Medical Decision Making Amount and/or Complexity of Data Reviewed Labs: ordered. Radiology: ordered.  Risk OTC drugs. Prescription drug management. Decision regarding hospitalization.   25F presenting to the ED with acute appendicitis, waiting, on gen surg to determine OR availability. Pt admitted to [facility] under CCMD per Dr Cherlynn Cornfield, likely no OR availability tonight.       Rosealee Concha, MD 04/04/24 [phone]

## 2024-04-04 NOTE — ED Provider Notes (Signed)
 [facility] Provider Note  CSN: [id number] Arrival date & time: 04/04/24 1017  Chief Complaint(s) Abdominal Pain  HPI Kerry Smith is a 33 y.o. female with past medical history as below, significant for asthma, eczema who presents to the ED with complaint of abdominal pain  Patient reports abdominal pain, cramping, sharp pain right sided since yesterday.  Began after eating lunch.  She had 1 episode of emesis, some mild nausea intermittently.  Reports she feels bloated and "gassy."  Surgical history includes C-section.  Denies similar symptoms in the past.  No fevers or chills, no change in bowel or bladder function.  Past Medical History Past Medical History:  Diagnosis Date   Asthma    Eczema    There are no active problems to display for this patient.  Home Medication(s) Prior to Admission medications   Medication Sig Start Date End Date Taking? Authorizing Provider  clobetasol  ointment (TEMOVATE ) 0.05 % 1 applicaton twice a day as needed for sever flares avoiding face groin and armpits 12/09/23   Orelia Binet, MD  Crisaborole  (EUCRISA ) 2 % OINT Apply 1 Application topically 2 (two) times daily. 12/10/23   Orelia Binet, MD  DUPIXENT 300 MG/2ML prefilled syringe INJECT 600 MG (2 SYRINGES) UNDER THE SKIN ON DAY 1, THEN INJECT 300 MG (1 SYRINGE) EVERY OTHER WEEK THEREAFTER STARTING ON DAY 15 02/15/24   Orelia Binet, MD  fexofenadine (ALLEGRA) 180 MG tablet Take 180 mg by mouth daily.    [provider]  fluticasone  (FLONASE ) 50 MCG/ACT nasal spray Place 2 sprays into both nostrils daily. 09/22/22   Orelia Binet, MD  hydrocortisone  2.5 % cream Apply topically 2 (two) times daily. 11/22/23   Orelia Binet, MD  montelukast  (SINGULAIR ) 10 MG tablet Take 1 tablet (10 mg total) by mouth at bedtime. 11/22/23   Orelia Binet, MD  spironolactone (ALDACTONE) 50 MG tablet Take 50 mg by mouth daily. 07/27/23   [provider]  tacrolimus  (PROTOPIC ) 0.1 % ointment Apply topically 2 (two) times daily. 11/22/23   Orelia Binet, MD  triamcinolone  ointment (KENALOG ) 0.1 % 1 application twice a day as needed for mild flares avoiding face groin and arm pits 12/09/23   Orelia Binet, MD                                                                                                                                    Past Surgical History Past Surgical History:  Procedure Laterality Date   CESAREAN SECTION     Family History Family History  Problem Relation Age of Onset   Eczema Son    Allergic rhinitis Neg Hx    Angioedema Neg Hx    Asthma Neg Hx    Atopy Neg Hx    Immunodeficiency Neg Hx    Urticaria Neg Hx     Social History Social History   Tobacco Use  Smoking status: Never    Passive exposure: Never   Smokeless tobacco: Never  Vaping Use   Vaping status: Never Used  Substance Use Topics   Alcohol use: Yes    Alcohol/week: 1.0 - 2.0 standard drink of alcohol    Types: 1 - 2 Shots of liquor per week    Comment: occasional   Drug use: No   Allergies Amoxicillin and Septra  [sulfamethoxazole -trimethoprim ]  Review of Systems A thorough review of systems was obtained and all systems are negative except as noted in the HPI and PMH.   Physical Exam Vital Signs  I have reviewed the triage vital signs BP 104/73   Pulse 60   Temp 98.1 F (36.7 C)   Resp 17   Ht 5\' 6"  (1.676 m)   Wt 116.6 kg   LMP 03/30/2024 (Exact Date)   SpO2 99%   BMI 41.48 kg/m  Physical Exam Vitals and nursing note reviewed.  Constitutional:      General: She is not in acute distress.    Appearance: Normal appearance. She is obese.  HENT:     Head: Normocephalic and atraumatic.     Right Ear: External ear normal.     Left Ear: External ear normal.     Nose: Nose normal.     Mouth/Throat:     Mouth: Mucous membranes are moist.  Eyes:     General: No scleral icterus.       Right eye: No  discharge.        Left eye: No discharge.  Cardiovascular:     Rate and Rhythm: Normal rate and regular rhythm.     Pulses: Normal pulses.     Heart sounds: Normal heart sounds.  Pulmonary:     Effort: Pulmonary effort is normal. No respiratory distress.     Breath sounds: Normal breath sounds. No stridor.  Abdominal:     General: Abdomen is flat. There is no distension.     Palpations: Abdomen is soft.     Tenderness: There is abdominal tenderness in the right upper quadrant and right lower quadrant. There is no guarding. Negative signs include Murphy's sign.  Musculoskeletal:     Cervical back: No rigidity.     Right lower leg: No edema.     Left lower leg: No edema.  Skin:    General: Skin is warm and dry.     Capillary Refill: Capillary refill takes less than 2 seconds.  Neurological:     Mental Status: She is alert.  Psychiatric:        Mood and Affect: Mood normal.        Behavior: Behavior normal. Behavior is cooperative.     ED Results and Treatments Labs (all labs ordered are listed, but only abnormal results are displayed) Labs Reviewed  CBC WITH DIFFERENTIAL/PLATELET  COMPREHENSIVE METABOLIC PANEL WITH GFR  LIPASE, BLOOD  URINALYSIS, ROUTINE W REFLEX MICROSCOPIC  PREGNANCY, URINE  Radiology CT ABDOMEN PELVIS W CONTRAST Result Date: 04/04/2024 CLINICAL DATA:  Right lower quadrant pain EXAM: CT ABDOMEN AND PELVIS WITH CONTRAST TECHNIQUE: Multidetector CT imaging of the abdomen and pelvis was performed using the standard protocol following bolus administration of intravenous contrast. RADIATION DOSE REDUCTION: This exam was performed according to the departmental dose-optimization program which includes automated exposure control, adjustment of the mA and/or kV according to patient size and/or use of iterative reconstruction technique. CONTRAST:   100mL OMNIPAQUE IOHEXOL 300 MG/ML  SOLN COMPARISON:  None Available. FINDINGS: Lower chest: No acute abnormality Hepatobiliary: No focal hepatic abnormality. Gallbladder unremarkable. Pancreas: No focal abnormality or ductal dilatation. Spleen: No focal abnormality.  Normal size. Adrenals/Urinary Tract: No adrenal abnormality. No focal renal abnormality. No stones or hydronephrosis. Urinary bladder is unremarkable. Stomach/Bowel: Appendix is distended measuring up to 11 mm. Appendicolith at the tip of the appendix. There is surrounding inflammation around the distal aspect of the appendix. Findings compatible with acute appendicitis. No complicating feature. Stomach, large and small bowel grossly unremarkable. Vascular/Lymphatic: No evidence of aneurysm or adenopathy. Reproductive: Uterus and adnexa unremarkable.  No mass. Other: Small amount of free fluid in the pelvis.  No free air. Musculoskeletal: No acute bony abnormality. IMPRESSION: Dilated appendix with appendicolith at the tip and surrounding inflammation concerning for acute appendicitis. No complicating feature. These results were called by telephone at the time of interpretation on 04/04/2024 at 2:28 pm to provider Russella Courts , who verbally acknowledged these results. Electronically Signed   By: Janeece Mechanic M.D.   On: 04/04/2024 14:30    Pertinent labs & imaging results that were available during my care of the patient were reviewed by me and considered in my medical decision making (see MDM for details).  Medications Ordered in ED Medications  ciprofloxacin (CIPRO) IVPB 400 mg (400 mg Intravenous New Bag/Given 04/04/24 1455)    And  metroNIDAZOLE  (FLAGYL ) IVPB 500 mg (has no administration in time range)  pantoprazole (PROTONIX) injection 40 mg (40 mg Intravenous Given 04/04/24 1146)  alum & mag hydroxide-simeth (MAALOX/MYLANTA) 200-200-20 MG/5ML suspension 30 mL (30 mLs Oral Given 04/04/24 1145)  sodium chloride 0.9 % bolus 1,000 mL (0 mLs  Intravenous Stopped 04/04/24 1456)  ondansetron (ZOFRAN) injection 4 mg (4 mg Intravenous Given 04/04/24 1145)  iohexol (OMNIPAQUE) 300 MG/ML solution 100 mL (100 mLs Intravenous Contrast Given 04/04/24 1238)  ondansetron (ZOFRAN) injection 4 mg (4 mg Intravenous Given 04/04/24 1350)  ketorolac (TORADOL) 15 MG/ML injection 15 mg (15 mg Intravenous Given 04/04/24 1350)  0.9 %  sodium chloride infusion ( Intravenous New Bag/Given 04/04/24 1454)                                                                                                                                     Procedures Procedures  (including critical care time)  Medical Decision Making / ED Course    Medical Decision Making:  Jhene Westmoreland is a 33 y.o. female with past medical history as below, significant for asthma, eczema who presents to the ED with complaint of abdominal pain. The complaint involves an extensive differential diagnosis and also carries with it a high risk of complications and morbidity.  Serious etiology was considered. Ddx includes but is not limited to: Differential diagnosis includes but is not exclusive to acute cholecystitis, intrathoracic causes for epigastric abdominal pain, gastritis, duodenitis, pancreatitis, small bowel or large bowel obstruction, abdominal aortic aneurysm, hernia, gastritis, cystitis, pyelonephritis, nephrolithiasis etc.   Complete initial physical exam performed, notably the patient was in no distress, HDS.    Reviewed and confirmed nursing documentation for past medical history, family history, social history.  Vital signs reviewed.     Brief summary:  33 year old female history as above here with abdominal pain, nausea, vomiting, bloated sensation Abdomen is soft, nonperitoneal.  She is TTP to the right upper quadrant and right lower quadrant   Clinical Course as of 04/04/24 1541  Tue Apr 04, 2024  1443 Ct w/ uncomplicated appendicitis, start abx (amox allergy ) will d/w gen surg  [SG]  1517 Spoke w/ gen surg, will call back with OR availability  [SG]    Clinical Course User Index [SG] Teddi Favors, DO     Discussed results with pt/mother at bedside Will start abx Plan for surgery, awaiting callback from gen surg regarding timing/location  Handoff Dr Adrain Alar pending c/b from gen surg               Additional history obtained: -Additional history obtained from family -External records from outside source obtained and reviewed including: Chart review including previous notes, labs, imaging, consultation notes including  Primary care documentation, home medications, allergy  list   Lab Tests: -I ordered, reviewed, and interpreted labs.   The pertinent results include:   Labs Reviewed  CBC WITH DIFFERENTIAL/PLATELET  COMPREHENSIVE METABOLIC PANEL WITH GFR  LIPASE, BLOOD  URINALYSIS, ROUTINE W REFLEX MICROSCOPIC  PREGNANCY, URINE    Notable for labs stable  EKG   EKG Interpretation Date/Time:    Ventricular Rate:    PR Interval:    QRS Duration:    QT Interval:    QTC Calculation:   R Axis:      Text Interpretation:           Imaging Studies ordered: I ordered imaging studies including CT abdomen pelvis I independently visualized the following imaging with scope of interpretation limited to determining acute life threatening conditions related to emergency care; findings noted above I agree with the radiologist interpretation If any imaging was obtained with contrast I closely monitored patient for any possible adverse reaction a/w contrast administration in the emergency department   Medicines ordered and prescription drug management: Meds ordered this encounter  Medications   pantoprazole (PROTONIX) injection 40 mg   alum & mag hydroxide-simeth (MAALOX/MYLANTA) 200-200-20 MG/5ML suspension 30 mL   sodium chloride 0.9 % bolus 1,000 mL   ondansetron (ZOFRAN) injection 4 mg   iohexol (OMNIPAQUE) 300 MG/ML solution 100 mL    ondansetron (ZOFRAN) injection 4 mg   ketorolac (TORADOL) 15 MG/ML injection 15 mg   AND Linked Order Group    ciprofloxacin (CIPRO) IVPB 400 mg     Antibiotic Indication::   Intra-abdominal Infection    metroNIDAZOLE  (FLAGYL ) IVPB 500 mg     Antibiotic Indication::   Intra-abdominal Infection   0.9 %  sodium chloride infusion    -I have reviewed the patients home  medicines and have made adjustments as needed   Consultations Obtained: I requested consultation with the gen surg,  and discussed lab and imaging findings as well as pertinent plan - they recommend: surgery, will callback with details   Cardiac Monitoring: Continuous pulse oximetry interpreted by myself, 100% on ra.    Social Determinants of Health:  Diagnosis or treatment significantly limited by social determinants of health: obesity   Reevaluation: After the interventions noted above, I reevaluated the patient and found that they have improved  Co morbidities that complicate the patient evaluation  Past Medical History:  Diagnosis Date   Asthma    Eczema       Dispostion: Disposition decision including need for hospitalization was considered, and patient admitted to the hospital.    Final Clinical Impression(s) / ED Diagnoses Final diagnoses:  Acute appendicitis, unspecified acute appendicitis type        Teddi Favors, DO 04/04/24 1541

## 2024-04-04 NOTE — ED Triage Notes (Signed)
 C/o intermittent mid abdominal pain since yesterday with nausea. Denies diarrhea. 1 episode vomiting yesterday. States pain is a dull ache, also bloated and gassy. Last [facility] Saturday

## 2024-04-05 ENCOUNTER — Inpatient Hospital Stay (HOSPITAL_BASED_OUTPATIENT_CLINIC_OR_DEPARTMENT_OTHER): Payer: Self-pay | Admitting: Anesthesiology

## 2024-04-05 ENCOUNTER — Inpatient Hospital Stay (HOSPITAL_COMMUNITY): Payer: Self-pay | Admitting: Anesthesiology

## 2024-04-05 ENCOUNTER — Encounter (HOSPITAL_COMMUNITY): Payer: Self-pay

## 2024-04-05 ENCOUNTER — Encounter (HOSPITAL_COMMUNITY)
Admission: EM | Disposition: A | Discharge status: Home / Self Care | Payer: Self-pay | Source: Home / Self Care | Attending: Emergency Medicine

## 2024-04-05 ENCOUNTER — Other Ambulatory Visit: Payer: Self-pay

## 2024-04-05 DIAGNOSIS — Z6841 Body Mass Index (BMI) 40.0 and over, adult: Secondary | ICD-10-CM | POA: Diagnosis not present

## 2024-04-05 DIAGNOSIS — K37 Unspecified appendicitis: Secondary | ICD-10-CM | POA: Diagnosis not present

## 2024-04-05 DIAGNOSIS — E66813 Obesity, class 3: Secondary | ICD-10-CM

## 2024-04-05 HISTORY — PX: LAPAROSCOPIC APPENDECTOMY: SHX408

## 2024-04-05 SURGERY — APPENDECTOMY, LAPAROSCOPIC
Anesthesia: General

## 2024-04-05 MED ORDER — ACETAMINOPHEN 500 MG PO TABS
1000.0000 mg | ORAL_TABLET | Freq: Once | ORAL | Status: AC
  Administered 2024-04-05: 1000 mg via ORAL

## 2024-04-05 MED ORDER — FENTANYL CITRATE PF 50 MCG/ML IJ SOSY
50.0000 ug | PREFILLED_SYRINGE | Freq: Once | INTRAMUSCULAR | Status: AC
  Administered 2024-04-05: 50 ug via INTRAVENOUS

## 2024-04-05 MED ORDER — FENTANYL CITRATE PF 50 MCG/ML IJ SOSY
PREFILLED_SYRINGE | INTRAMUSCULAR | Status: AC
  Filled 2024-04-05: qty 1

## 2024-04-05 MED ORDER — DEXAMETHASONE SODIUM PHOSPHATE 10 MG/ML IJ SOLN
INTRAMUSCULAR | Status: DC | PRN
  Administered 2024-04-05: 8 mg via INTRAVENOUS

## 2024-04-05 MED ORDER — ARTIFICIAL TEARS OPHTHALMIC OINT
TOPICAL_OINTMENT | OPHTHALMIC | Status: AC
  Filled 2024-04-05: qty 3.5

## 2024-04-05 MED ORDER — ONDANSETRON HCL 4 MG/2ML IJ SOLN
INTRAMUSCULAR | Status: DC | PRN
  Administered 2024-04-05: 4 mg via INTRAVENOUS

## 2024-04-05 MED ORDER — FENTANYL CITRATE PF 50 MCG/ML IJ SOSY
PREFILLED_SYRINGE | INTRAMUSCULAR | Status: AC
  Administered 2024-04-05: 50 ug
  Filled 2024-04-05: qty 1

## 2024-04-05 MED ORDER — FENTANYL CITRATE (PF) 100 MCG/2ML IJ SOLN
INTRAMUSCULAR | Status: DC | PRN
  Administered 2024-04-05 (×4): 50 ug via INTRAVENOUS

## 2024-04-05 MED ORDER — FENTANYL CITRATE (PF) 100 MCG/2ML IJ SOLN
INTRAMUSCULAR | Status: AC
  Filled 2024-04-05: qty 2

## 2024-04-05 MED ORDER — ROCURONIUM BROMIDE 10 MG/ML (PF) SYRINGE
PREFILLED_SYRINGE | INTRAVENOUS | Status: AC
  Filled 2024-04-05: qty 10

## 2024-04-05 MED ORDER — OXYCODONE HCL 5 MG/5ML PO SOLN: 5.0000 mg | Freq: Once | ORAL | Status: DC | PRN

## 2024-04-05 MED ORDER — DEXAMETHASONE SODIUM PHOSPHATE 10 MG/ML IJ SOLN
INTRAMUSCULAR | Status: AC
  Filled 2024-04-05: qty 1

## 2024-04-05 MED ORDER — MIDAZOLAM HCL 5 MG/5ML IJ SOLN
INTRAMUSCULAR | Status: DC | PRN
Start: 2024-04-05 — End: 2024-04-05
  Administered 2024-04-05: 2 mg via INTRAVENOUS

## 2024-04-05 MED ORDER — PROPOFOL 10 MG/ML IV BOLUS
INTRAVENOUS | Status: DC | PRN
  Administered 2024-04-05: 200 mg via INTRAVENOUS

## 2024-04-05 MED ORDER — DEXMEDETOMIDINE HCL IN NACL 80 MCG/20ML IV SOLN
INTRAVENOUS | Status: AC
  Filled 2024-04-05: qty 20

## 2024-04-05 MED ORDER — LIDOCAINE HCL (PF) 1 % IJ SOLN
INTRAMUSCULAR | Status: AC
  Filled 2024-04-05: qty 30

## 2024-04-05 MED ORDER — DEXMEDETOMIDINE HCL IN NACL 80 MCG/20ML IV SOLN
INTRAVENOUS | Status: DC | PRN
Start: 2024-04-05 — End: 2024-04-05
  Administered 2024-04-05: 4 ug via INTRAVENOUS
  Administered 2024-04-05: 8 ug via INTRAVENOUS

## 2024-04-05 MED ORDER — IBUPROFEN 200 MG PO TABS: 600.0000 mg | ORAL_TABLET | Freq: Three times a day (TID) | ORAL | Status: AC | PRN

## 2024-04-05 MED ORDER — ROCURONIUM BROMIDE 10 MG/ML (PF) SYRINGE
PREFILLED_SYRINGE | INTRAVENOUS | Status: DC | PRN
Start: 2024-04-05 — End: 2024-04-05
  Administered 2024-04-05: 50 mg via INTRAVENOUS

## 2024-04-05 MED ORDER — ONDANSETRON HCL 4 MG/2ML IJ SOLN
INTRAMUSCULAR | Status: AC
  Filled 2024-04-05: qty 2

## 2024-04-05 MED ORDER — IBUPROFEN 400 MG PO TABS
800.0000 mg | ORAL_TABLET | Freq: Three times a day (TID) | ORAL | Status: DC
  Administered 2024-04-05: 800 mg via ORAL
  Filled 2024-04-05: qty 2

## 2024-04-05 MED ORDER — PROPOFOL 10 MG/ML IV BOLUS
INTRAVENOUS | Status: AC
  Filled 2024-04-05: qty 20

## 2024-04-05 MED ORDER — AMISULPRIDE (ANTIEMETIC) 5 MG/2ML IV SOLN: 10.0000 mg | Freq: Once | INTRAVENOUS | Status: DC | PRN

## 2024-04-05 MED ORDER — ACETAMINOPHEN 500 MG PO TABS
1000.0000 mg | ORAL_TABLET | Freq: Three times a day (TID) | ORAL | Status: DC
  Administered 2024-04-05: 1000 mg via ORAL
  Filled 2024-04-05: qty 2

## 2024-04-05 MED ORDER — LIDOCAINE 2% (20 MG/ML) 5 ML SYRINGE
INTRAMUSCULAR | Status: DC | PRN
  Administered 2024-04-05: 100 mg via INTRAVENOUS

## 2024-04-05 MED ORDER — EPHEDRINE SULFATE-NACL 50-0.9 MG/10ML-% IV SOSY
PREFILLED_SYRINGE | INTRAVENOUS | Status: DC | PRN
  Administered 2024-04-05: 5 mg via INTRAVENOUS

## 2024-04-05 MED ORDER — 0.9 % SODIUM CHLORIDE (POUR BTL) OPTIME
TOPICAL | Status: DC | PRN
  Administered 2024-04-05: 1000 mL

## 2024-04-05 MED ORDER — LIDOCAINE HCL (PF) 2 % IJ SOLN
INTRAMUSCULAR | Status: AC
  Filled 2024-04-05: qty 5

## 2024-04-05 MED ORDER — SODIUM CHLORIDE 0.9 % IV SOLN: 2.0000 g | INTRAVENOUS | Status: DC

## 2024-04-05 MED ORDER — LIDOCAINE HCL (PF) 1 % IJ SOLN
INTRAMUSCULAR | Status: DC | PRN
  Administered 2024-04-05: 8 mL

## 2024-04-05 MED ORDER — BUPIVACAINE-EPINEPHRINE 0.25% -1:200000 IJ SOLN
INTRAMUSCULAR | Status: DC | PRN
  Administered 2024-04-05: 8 mL

## 2024-04-05 MED ORDER — BUPIVACAINE-EPINEPHRINE (PF) 0.25% -1:200000 IJ SOLN
INTRAMUSCULAR | Status: AC
  Filled 2024-04-05: qty 30

## 2024-04-05 MED ORDER — ORAL CARE MOUTH RINSE: 15.0000 mL | Freq: Once | OROMUCOSAL | Status: AC

## 2024-04-05 MED ORDER — OXYCODONE HCL 5 MG PO TABS: 5.0000 mg | ORAL_TABLET | Freq: Once | ORAL | Status: DC | PRN

## 2024-04-05 MED ORDER — CHLORHEXIDINE GLUCONATE 0.12 % MT SOLN
15.0000 mL | Freq: Once | OROMUCOSAL | Status: AC
  Administered 2024-04-05: 15 mL via OROMUCOSAL

## 2024-04-05 MED ORDER — LACTATED RINGERS IV SOLN: INTRAVENOUS | Status: DC

## 2024-04-05 MED ORDER — ACETAMINOPHEN 500 MG PO TABS
ORAL_TABLET | ORAL | Status: AC
  Filled 2024-04-05: qty 2

## 2024-04-05 MED ORDER — SUGAMMADEX SODIUM 200 MG/2ML IV SOLN
INTRAVENOUS | Status: DC | PRN
Start: 2024-04-05 — End: 2024-04-05
  Administered 2024-04-05: 200 mg via INTRAVENOUS

## 2024-04-05 MED ORDER — FENTANYL CITRATE PF 50 MCG/ML IJ SOSY: 50.0000 ug | PREFILLED_SYRINGE | Freq: Once | INTRAMUSCULAR | Status: DC

## 2024-04-05 MED ORDER — MIDAZOLAM HCL 2 MG/2ML IJ SOLN
INTRAMUSCULAR | Status: AC
  Filled 2024-04-05: qty 2

## 2024-04-05 MED ORDER — METRONIDAZOLE 500 MG/100ML IV SOLN: 500.0000 mg | Freq: Once | INTRAVENOUS | Status: DC

## 2024-04-05 MED ORDER — FENTANYL CITRATE PF 50 MCG/ML IJ SOSY: 25.0000 ug | PREFILLED_SYRINGE | INTRAMUSCULAR | Status: DC | PRN

## 2024-04-05 MED ORDER — OXYCODONE HCL 5 MG PO TABS: 5.0000 mg | ORAL_TABLET | Freq: Four times a day (QID) | ORAL | 0 refills | Status: DC | PRN

## 2024-04-05 MED ORDER — OXYCODONE HCL 5 MG PO TABS
5.0000 mg | ORAL_TABLET | ORAL | Status: DC | PRN
  Administered 2024-04-05: 5 mg via ORAL
  Filled 2024-04-05: qty 1

## 2024-04-05 SURGICAL SUPPLY — 32 items
BAG COUNTER SPONGE SURGICOUNT (BAG) IMPLANT
CABLE HIGH FREQUENCY MONO STRZ (ELECTRODE) ×1 IMPLANT
CLIP APPLIE ROT 10 11.4 M/L (STAPLE) IMPLANT
COVER SURGICAL LIGHT HANDLE (MISCELLANEOUS) ×1 IMPLANT
CUTTER FLEX LINEAR 45M (STAPLE) IMPLANT
DERMABOND ADVANCED .7 DNX12 (GAUZE/BANDAGES/DRESSINGS) ×1 IMPLANT
DRAPE LAPAROSCOPIC ABDOMINAL (DRAPES) ×1 IMPLANT
ELECT REM PT RETURN 15FT ADLT (MISCELLANEOUS) ×1 IMPLANT
ENDOLOOP SUT PDS II 0 18 (SUTURE) IMPLANT
GLOVE BIO SURGEON STRL SZ 6 (GLOVE) ×1 IMPLANT
GLOVE INDICATOR 6.5 STRL GRN (GLOVE) ×1 IMPLANT
GOWN STRL REUS W/ TWL XL LVL3 (GOWN DISPOSABLE) ×1 IMPLANT
IRRIGATION SUCT STRKRFLW 2 WTP (MISCELLANEOUS) ×1 IMPLANT
KIT BASIN OR (CUSTOM PROCEDURE TRAY) ×1 IMPLANT
KIT TURNOVER KIT A (KITS) IMPLANT
PENCIL SMOKE EVACUATOR (MISCELLANEOUS) IMPLANT
RELOAD 45 VASCULAR/THIN (ENDOMECHANICALS) IMPLANT
RELOAD STAPLE 45 2.5 WHT GRN (ENDOMECHANICALS) IMPLANT
RELOAD STAPLE 45 3.5 BLU ETS (ENDOMECHANICALS) IMPLANT
RELOAD STAPLE TA45 3.5 REG BLU (ENDOMECHANICALS) ×1 IMPLANT
SCISSORS LAP 5X35 DISP (ENDOMECHANICALS) IMPLANT
SET TUBE SMOKE EVAC HIGH FLOW (TUBING) ×1 IMPLANT
SHEARS HARMONIC 36 ACE (MISCELLANEOUS) ×1 IMPLANT
SLEEVE Z-THREAD 5X100MM (TROCAR) ×1 IMPLANT
SPIKE FLUID TRANSFER (MISCELLANEOUS) ×1 IMPLANT
SUT MNCRL AB 4-0 PS2 18 (SUTURE) ×1 IMPLANT
SYSTEM BAG RETRIEVAL 10MM (BASKET) ×1 IMPLANT
TOWEL OR 17X26 10 PK STRL BLUE (TOWEL DISPOSABLE) ×1 IMPLANT
TRAY FOLEY MTR SLVR 16FR STAT (SET/KITS/TRAYS/PACK) IMPLANT
TRAY LAPAROSCOPIC (CUSTOM PROCEDURE TRAY) ×1 IMPLANT
TROCAR BALLN 12MMX100 BLUNT (TROCAR) ×1 IMPLANT
TROCAR Z-THREAD OPTICAL 5X100M (TROCAR) ×1 IMPLANT

## 2024-04-05 NOTE — Anesthesia Postprocedure Evaluation (Signed)
 Anesthesia Post Note  Patient: Kerry Smith  Procedure(s) Performed: APPENDECTOMY, LAPAROSCOPIC     Patient location during evaluation: PACU Anesthesia Type: General Level of consciousness: awake and alert Pain management: pain level controlled Vital Signs Assessment: post-procedure vital signs reviewed and stable Respiratory status: spontaneous breathing, nonlabored ventilation, respiratory function stable and patient connected to nasal cannula oxygen Cardiovascular status: blood pressure returned to baseline and stable Postop Assessment: no apparent nausea or vomiting Anesthetic complications: no  No notable events documented.  Last Vitals:  Vitals:   04/05/24 1221 04/05/24 1325  BP: 110/65 105/70  Pulse: 69 71  Resp: 18 16  Temp: 36.8 C 36.8 C  SpO2: 98% 98%    Last Pain:  Vitals:   04/05/24 1325  TempSrc: Oral  PainSc:                  Stepahnie Campo L Korena Nass

## 2024-04-05 NOTE — Discharge Instructions (Signed)

## 2024-04-05 NOTE — Transfer of Care (Signed)
 Immediate Anesthesia Transfer of Care Note  Patient: Kerry Smith  Procedure(s) Performed: APPENDECTOMY, LAPAROSCOPIC  Patient Location: PACU  Anesthesia Type:General  Level of Consciousness: awake, alert , oriented, and patient cooperative  Airway & Oxygen Therapy: Patient Spontanous Breathing and Patient connected to face mask oxygen  Post-op Assessment: Report given to RN, Post -op Vital signs reviewed and stable, and Patient moving all extremities  Post vital signs: Reviewed and stable  Last Vitals:  Vitals Value Taken Time  BP 118/67 04/05/24 1023  Temp 36.4 C 04/05/24 1023  Pulse 70 04/05/24 1024  Resp 10 04/05/24 1024  SpO2 100 % 04/05/24 1024  Vitals shown include unfiled device data.  Last Pain:  Vitals:   04/05/24 0722  TempSrc:   PainSc: 7       Patients Stated Pain Goal: 3 (04/05/24 0703)  Complications: No notable events documented.

## 2024-04-05 NOTE — Op Note (Signed)
 Appendectomy, Lap, Procedure Note  Indications: The patient presented with a history of right-sided abdominal pain. A CT revealed findings consistent with acute appendicitis.  Pre-operative Diagnosis: acute appendicitis  Post-operative Diagnosis: Same  Surgeon: Lockie Rima   Assistants: None  Anesthesia: General endotracheal anesthesia  ASA Class: 2  Procedure Details  The patient was seen again in the Holding Room. The risks, benefits, complications, treatment options, and expected outcomes were discussed with the patient and/or family. The possibilities of perforation of viscus, bleeding, recurrent infection, the need for additional procedures, failure to diagnose a condition, and creating a complication requiring transfusion or operation were discussed. There was concurrence with the proposed plan and informed consent was obtained. The site of surgery was properly noted. The patient was taken to Operating Room, identified as Kerry Smith and the procedure verified as Appendectomy. A Time Out was held and the above information confirmed.  The patient was placed in the supine position and general anesthesia was induced, along with placement of orogastric tube, Venodyne boots, and a Foley catheter. The abdomen was prepped and draped in a sterile fashion. Local anesthetic was infiltrated in the infraumbilical region.  A 1.5 cm supraumbilical vertical incision was made just below the umbilicus.  The Kelly clamp was used to spread the subcutaneous tissues.  The fascia was elevated with 2 Kocher clamps and incised with the #11 blade.  A Loetta Ringer was used to confirm entrance into the peritoneal cavity.  A pursestring suture was placed around the fascial incision.  The Hasson trocar was inserted into the abdomen and held in place with the tails of the suture.  The pneumoperitoneum was then established to steady pressure of 15 mmHg.     Additional 5 mm cannulas then placed in the left lower quadrant of  the abdomen and the suprapubic region under direct visualization.  A careful evaluation of the entire abdomen was carried out. The patient was placed in Trendelenburg and rotated to the left.  The small intestines were retracted in the cephalad and left lateral direction away from the pelvis and right lower quadrant. The patient was found to have an enlarged and inflamed appendix in the pelvic position. There was no evidence of perforation.  The appendix was curled up and was behind the ligament of Treves.  The appendix was carefully dissected. The appendix was was skeletonized with the harmonic scalpel.   The appendix was divided at its base using an endo-GIA stapler.  No appreciable appendiceal stump was left in place. The appendix was removed from the abdomen with an Endocatch bag through the umbilical port.  There was no evidence of bleeding, leakage, or complication after division of the appendix. Irrigation was also performed and irrigate suctioned from the abdomen as well.  The 5 mm trocars were removed.  The pneumoperitoneum was evacuated from the abdomen.    The trocar site skin wounds were closed with 4-0 Monocryl and dressed with Dermabond.  Instrument, sponge, and needle counts were correct at the conclusion of the case.   Findings: The appendix was found to be inflamed. There were not signs of necrosis.  There was not perforation. There was not abscess formation.  No gross intra-abdominal pathology other than the appendix was seen.  Grossly normal female reproductive organs.  Estimated Blood Loss:  Minimal         Drains: None          Specimens: Appendix to pathology         Complications:  None; patient tolerated the procedure well.         Disposition: PACU - hemodynamically stable.         Condition: stable

## 2024-04-05 NOTE — H&P (Addendum)
 Kerry Smith 12-20-1990  [id number].    Requesting MD: Martina Sledge, MD Chief Complaint/Reason for Consult: appendicitis   HPI:  Kerry Smith is a 33 y/o F with a PMH asthma and c-section who presents with abdominal pain. States that on Monday at 11 AM she developed nausea and abdominal bloating that progressed to right sided, constant, non-radiating abdominal pain. She reports one episode of emesis on Monday. States she had to leave work early on Monday and her symptoms persisted so she sought medical attention on Tuesday. She denies fever, chills, urinary sxs, blood in her stool, diarrhea, or constipation. She says at baseline she has a BM every 2-3 days. She says she just completed her monthly menstrual cycle. She denies tobacco or drug use. 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  ROS: Review of Systems  All other systems reviewed and are negative.   Family History  Problem Relation Age of Onset   Eczema Son    Allergic rhinitis Neg Hx    Angioedema Neg Hx    Asthma Neg Hx    Atopy Neg Hx    Immunodeficiency Neg Hx    Urticaria Neg Hx     Past Medical History:  Diagnosis Date   Asthma    Eczema     Past Surgical History:  Procedure Laterality Date   CESAREAN SECTION      Social History:  reports that she has never smoked. She has never been exposed to tobacco smoke. She has never used smokeless tobacco. She reports current alcohol use of about 1.0 - 2.0 standard drink of alcohol per week. She reports that she does not use drugs.  Allergies:  Allergies  Allergen Reactions   Almond Oil     Tested allergic   Hazelnut (Filbert) Other (See Comments)    Tested allergic to this    Pistachio Nut Extract Other (See Comments)    Tested allergic to this   Thimerosal (Thiomersal) Hives and Other (See Comments)    Mercury-based preservative   Amoxicillin Rash   Septra  [Sulfamethoxazole -Trimethoprim ] Rash    Medications Prior to Admission  Medication Sig Dispense Refill    fexofenadine (ALLEGRA) 180 MG tablet Take 180 mg by mouth at bedtime.     tacrolimus  (PROTOPIC ) 0.1 % ointment Apply topically 2 (two) times daily. (Patient taking differently: Apply 1 Application topically 2 (two) times daily as needed (for eczema flares).) 100 g 5   TYLENOL  500 MG tablet Take 500-1,000 mg by mouth every 6 (six) hours as needed for mild pain (pain score 1-3) (or headaches).     clobetasol  ointment (TEMOVATE ) 0.05 % 1 applicaton twice a day as needed for sever flares avoiding face groin and armpits (Patient not taking: Reported on 04/04/2024) 60 g 0   Crisaborole  (EUCRISA ) 2 % OINT Apply 1 Application topically 2 (two) times daily. (Patient not taking: Reported on 04/04/2024) 60 g 1   DUPIXENT 300 MG/2ML prefilled syringe INJECT 600 MG (2 SYRINGES) UNDER THE SKIN ON DAY 1, THEN INJECT 300 MG (1 SYRINGE) EVERY OTHER WEEK THEREAFTER STARTING ON DAY 15 (Patient not taking: Reported on 04/04/2024) 4 mL 11   fluticasone  (FLONASE ) 50 MCG/ACT nasal spray Place 2 sprays into both nostrils daily. (Patient not taking: Reported on 04/04/2024) 16 g 1   hydrocortisone  2.5 % cream Apply topically 2 (two) times daily. (Patient not taking: Reported on 04/04/2024) 453 g 1   montelukast  (SINGULAIR ) 10 MG tablet Take 1 tablet (10 mg  total) by mouth at bedtime. (Patient not taking: Reported on 04/04/2024) 90 tablet 1   triamcinolone  ointment (KENALOG ) 0.1 % 1 application twice a day as needed for mild flares avoiding face groin and arm pits (Patient not taking: Reported on 04/04/2024) 80 g 0     Physical Exam: Blood pressure 135/81, pulse 69, temperature 98 F (36.7 C), temperature source Oral, resp. rate 16, height 5\' 6"  (1.676 m), weight 116.6 kg, last menstrual period 03/30/2024, SpO2 98%. General: Pleasant black female laying on hospital bed, appears stated age, NAD. HEENT: head -normocephalic, atraumatic; Eyes: PERRLA, no conjunctival injection Neck- Trachea is midline CV- RRR, normal S1/S2, no M/R/G, no  lower extremity edema  Pulm- breathing is non-labored. CTABL, no wheezes, rhales, rhonchi. Abd- soft, obese, TTP right mid abdomen and right lower quadrant without guarding or peritonitis, NT/ND, no masses, hernias, or organomegaly. GU- deferred  MSK- UE/LE symmetrical, no cyanosis, clubbing, or edema. Neuro- CN II-XII grossly in tact, no paresthesias. Psych- Alert and Oriented x3 with appropriate affect Skin: warm and dry, no rashes or lesions   Results for orders placed or performed during the hospital encounter of 04/04/24 (from the past 48 hours)  Urinalysis, Routine w reflex microscopic -Urine, Clean Catch     Status: None   Collection Time: 04/04/24 10:32 AM  Result Value Ref Range   Color, Urine YELLOW YELLOW   APPearance CLEAR CLEAR   Specific Gravity, Urine 1.015 1.005 - 1.030   pH 6.0 5.0 - 8.0   Glucose, UA NEGATIVE NEGATIVE mg/dL   Hgb urine dipstick NEGATIVE NEGATIVE   Bilirubin Urine NEGATIVE NEGATIVE   Ketones, ur NEGATIVE NEGATIVE mg/dL   Protein, ur NEGATIVE NEGATIVE mg/dL   Nitrite NEGATIVE NEGATIVE   Leukocytes,Ua NEGATIVE NEGATIVE    Comment: Microscopic not done on urines with negative protein, blood, leukocytes, nitrite, or glucose < 500 mg/dL. Performed at [facility], [address] [place], [place] [address]   CBC with Differential     Status: None   Collection Time: 04/04/24 11:26 AM  Result Value Ref Range   WBC 6.8 4.0 - 10.5 K/uL   RBC 4.46 3.87 - 5.11 MIL/uL   Hemoglobin 12.5 12.0 - 15.0 g/dL   HCT 91.4 78.2 - 95.6 %   MCV 85.0 80.0 - 100.0 fL   MCH 28.0 26.0 - 34.0 pg   MCHC 33.0 30.0 - 36.0 g/dL   RDW 21.3 08.6 - 57.8 %   Platelets 274 150 - 400 K/uL   nRBC 0.0 0.0 - 0.2 %   Neutrophils Relative % 59 %   Neutro Abs 3.9 1.7 - 7.7 K/uL   Lymphocytes Relative 32 %   Lymphs Abs 2.2 0.7 - 4.0 K/uL   Monocytes Relative 6 %   Monocytes Absolute 0.4 0.1 - 1.0 K/uL   Eosinophils Relative 3 %   Eosinophils Absolute 0.2 0.0 - 0.5 K/uL    Basophils Relative 0 %   Basophils Absolute 0.0 0.0 - 0.1 K/uL   Immature Granulocytes 0 %   Abs Immature Granulocytes 0.02 0.00 - 0.07 K/uL    Comment: Performed at [facility], [address] [place], [place] [address]  Comprehensive metabolic panel     Status: None   Collection Time: 04/04/24 11:26 AM  Result Value Ref Range   Sodium 139 135 - 145 mmol/L   Potassium 4.0 3.5 - 5.1 mmol/L   Chloride 104 98 - 111 mmol/L   CO2 24 22 -  32 mmol/L   Glucose, Bld 82 70 - 99 mg/dL    Comment: Glucose reference range applies only to samples taken after fasting for at least 8 hours.   BUN 10 6 - 20 mg/dL   Creatinine, Ser 2.13 0.44 - 1.00 mg/dL   Calcium 9.2 8.9 - 08.6 mg/dL   Total Protein 7.4 6.5 - 8.1 g/dL   Albumin 4.2 3.5 - 5.0 g/dL   AST 27 15 - 41 U/L   ALT 16 0 - 44 U/L   Alkaline Phosphatase 87 38 - 126 U/L   Total Bilirubin 0.6 0.0 - 1.2 mg/dL   GFR, Estimated >57 >84 mL/min    Comment: (NOTE) Calculated using the CKD-EPI Creatinine Equation (2021)    Anion gap 11 5 - 15    Comment: Performed at [facility], [address] [place], [place] [address]  Lipase, blood     Status: None   Collection Time: 04/04/24 11:26 AM  Result Value Ref Range   Lipase 16 11 - 51 U/L    Comment: Performed at [facility], [address] [place], [place] [address]  Pregnancy, urine     Status: None   Collection Time: 04/04/24 11:26 AM  Result Value Ref Range   Preg Test, Ur NEGATIVE NEGATIVE    Comment:        THE SENSITIVITY OF THIS METHODOLOGY IS >25 mIU/mL. Performed at [facility], [address] [place], [place] [address]   Surgical PCR screen     Status: Abnormal   Collection Time: 04/04/24  6:23 PM   Specimen: Nasal Mucosa; Nasal Swab  Result Value Ref Range   MRSA, PCR NEGATIVE NEGATIVE   Staphylococcus aureus POSITIVE (A) NEGATIVE    Comment: (NOTE) The Xpert SA Assay (FDA approved for NASAL specimens in patients 22 years  of age and older), is one component of a comprehensive surveillance program. It is not intended to diagnose infection nor to guide or monitor treatment. Performed at [facility], [address], [place], [place] [address]   HIV Antibody (routine testing w rflx)     Status: None   Collection Time: 04/04/24  7:09 PM  Result Value Ref Range   HIV Screen 4th Generation wRfx Non Reactive Non Reactive    Comment: Performed at [facility], [address] [place], [place] [address]   CT ABDOMEN PELVIS W CONTRAST Result Date: 04/04/2024 CLINICAL DATA:  Right lower quadrant pain EXAM: CT ABDOMEN AND PELVIS WITH CONTRAST TECHNIQUE: Multidetector CT imaging of the abdomen and pelvis was performed using the standard protocol following bolus administration of intravenous contrast. RADIATION DOSE REDUCTION: This exam was performed according to the departmental dose-optimization program which includes automated exposure control, adjustment of the mA and/or kV according to patient size and/or use of iterative reconstruction technique. CONTRAST:  100mL OMNIPAQUE IOHEXOL 300 MG/ML  SOLN COMPARISON:  None Available. FINDINGS: Lower chest: No acute abnormality Hepatobiliary: No focal hepatic abnormality. Gallbladder unremarkable. Pancreas: No focal abnormality or ductal dilatation. Spleen: No focal abnormality.  Normal size. Adrenals/Urinary Tract: No adrenal abnormality. No focal renal abnormality. No stones or hydronephrosis. Urinary bladder is unremarkable. Stomach/Bowel: Appendix is distended measuring up to 11 mm. Appendicolith at the tip of the appendix. There is surrounding inflammation around the distal aspect of the appendix. Findings compatible with acute appendicitis. No complicating feature. Stomach, large and small bowel grossly unremarkable. Vascular/Lymphatic: No evidence of aneurysm or  adenopathy. Reproductive: Uterus and adnexa unremarkable.  No mass. Other: Small amount of free  fluid in the pelvis.  No free air. Musculoskeletal: No acute bony abnormality. IMPRESSION: Dilated appendix with appendicolith at the tip and surrounding inflammation concerning for acute appendicitis. No complicating feature. These results were called by telephone at the time of interpretation on 04/04/2024 at 2:28 pm to provider Russella Courts , who verbally acknowledged these results. Electronically Signed   By: Janeece Mechanic M.D.   On: 04/04/2024 14:30      Assessment/Plan Acute appendicitis with appendicolith, without evidence of perforation Afebrile, WBC yesterday was 6.8 History, physical exam, and CT scan are all consistent with appendicitis.  The operative and non-operative management of appendicitis was discussed with the patient. Risks of surgery including bleeding, infection, damage to surrounding structures, conversion to open, drain placement, need for additional procedures, prolonged hospital stay, as well as the risks of general anesthesia were discussed with the patient and she would like to proceed with surgery. Questions were welcomed and answered.    FEN - NPO, IVF VTE - SCD's ID - Rocephin/Flagyl  Admit - ccs, obs; possible PACU discharge  I reviewed nursing notes, ED provider notes, last 24 h vitals and pain scores, last 48 h intake and output, last 24 h labs and trends, and last 24 h imaging results.  Charlott Converse, [facility] Surgery 04/05/2024, 7:31 AM Please see Amion for pager number during day hours 7:00am-4:30pm or 7:00am -11:30am on weekends

## 2024-04-05 NOTE — Anesthesia Preprocedure Evaluation (Addendum)
 Anesthesia Evaluation  Patient identified by MRN, date of birth, ID band Patient awake    Reviewed: Allergy  & Precautions, NPO status , Patient's Chart, lab work & pertinent test results  Airway Mallampati: II  TM Distance: >3 FB Neck ROM: Full    Dental no notable dental hx. (+) Dental Advisory Given, Teeth Intact   Pulmonary asthma    Pulmonary exam normal breath sounds clear to auscultation       Cardiovascular negative cardio ROS Normal cardiovascular exam Rhythm:Regular Rate:Normal     Neuro/Psych negative neurological ROS  negative psych ROS   GI/Hepatic negative GI ROS, Neg liver ROS,,,  Endo/Other    Class 3 obesity (BMI 41)  Renal/GU negative Renal ROS  negative genitourinary   Musculoskeletal negative musculoskeletal ROS (+)    Abdominal   Peds  Hematology negative hematology ROS (+)   Anesthesia Other Findings   Reproductive/Obstetrics                             Anesthesia Physical Anesthesia Plan  ASA: 2  Anesthesia Plan: General   Post-op Pain Management: Tylenol  PO (pre-op)* and Precedex   Induction: Intravenous  PONV Risk Score and Plan: 3 and Midazolam, Dexamethasone and Ondansetron  Airway Management Planned: Oral ETT  Additional Equipment:   Intra-op Plan:   Post-operative Plan: Extubation in OR  Informed Consent: I have reviewed the patients History and Physical, chart, labs and discussed the procedure including the risks, benefits and alternatives for the proposed anesthesia with the patient or authorized representative who has indicated his/her understanding and acceptance.     Dental advisory given  Plan Discussed with: CRNA  Anesthesia Plan Comments:        Anesthesia Quick Evaluation

## 2024-04-05 NOTE — Plan of Care (Signed)

## 2024-04-05 NOTE — Anesthesia Procedure Notes (Signed)
 Procedure Name: Intubation Date/Time: 04/05/2024 9:01 AM  Performed by: Marshall Skeeter, CRNAPre-anesthesia Checklist: Patient identified, Emergency Drugs available, Suction available, Patient being monitored and Timeout performed Patient Re-evaluated:Patient Re-evaluated prior to induction Oxygen Delivery Method: Circle system utilized Preoxygenation: Pre-oxygenation with 100% oxygen Induction Type: IV induction Ventilation: Mask ventilation without difficulty Laryngoscope Size: Mac and 4 Grade View: Grade I Tube type: Oral Tube size: 7.5 mm Number of attempts: 1 Airway Equipment and Method: Stylet Placement Confirmation: ETT inserted through vocal cords under direct vision, positive ETCO2 and breath sounds checked- equal and bilateral Secured at: 21 cm Tube secured with: Tape Dental Injury: Teeth and Oropharynx as per pre-operative assessment

## 2024-04-06 ENCOUNTER — Encounter (HOSPITAL_COMMUNITY): Payer: Self-pay | Admitting: General Surgery

## 2024-04-06 LAB — SURGICAL PATHOLOGY

## 2024-04-11 NOTE — Discharge Summary (Addendum)
 [facility] Discharge Summary   Patient ID: Kerry Smith MRN: [id number] DOB/AGE: 11-09-1990 33 y.o.  Admit date: 04/04/2024 Discharge date: 04/05/24  Admitting Diagnosis: Appendicitis   Discharge Diagnosis Patient Active Problem List   Diagnosis Date Noted   Acute appendicitis 04/04/2024    Consultants None   Imaging: No results found.  Procedures Dr. Lockie Rima (04/05/24) - Laparoscopic Appendectomy  Hospital Course:  33 y/o F who presented to the ED with 2-3 days of abdominal pain.  Workup showed appendicitis.  Patient was admitted and underwent procedure listed above.  Tolerated procedure well and was transferred to the floor.  Diet was advanced as tolerated.  On POD#0, the patient was voiding well, tolerating diet, ambulating well, pain well controlled, vital signs stable, incisions c/d/i and felt stable for discharge home.  Patient will follow up in our office in 3-4 weeks and knows to call with questions or concerns.  She will call to confirm appointment date/time.    I have personally reviewed the patients medication history on the [place] controlled substance database.    Physical Exam: General:  Alert, NAD, pleasant Abd:  Soft, ND, mild tenderness, incisions C/D/I  Allergies as of 04/05/2024       Reactions   Almond Oil    Tested allergic   Hazelnut (filbert) Other (See Comments)   Tested allergic to this    Pistachio Nut Extract Other (See Comments)   Tested allergic to this   Thimerosal (thiomersal) Hives, Other (See Comments)   Mercury-based preservative   Amoxicillin Rash   Septra  [sulfamethoxazole -trimethoprim ] Rash        Medication List     TAKE these medications    clobetasol  ointment 0.05 % Commonly known as: TEMOVATE  1 applicaton twice a day as needed for sever flares avoiding face groin and armpits   Dupixent 300 MG/2ML prefilled syringe Generic drug: dupilumab INJECT 600 MG (2 SYRINGES) UNDER THE SKIN ON DAY 1, THEN INJECT 300 MG  (1 SYRINGE) EVERY OTHER WEEK THEREAFTER STARTING ON DAY 15   Eucrisa  2 % Oint Generic drug: Crisaborole  Apply 1 Application topically 2 (two) times daily.   fexofenadine 180 MG tablet Commonly known as: ALLEGRA Take 180 mg by mouth at bedtime.   fluticasone  50 MCG/ACT nasal spray Commonly known as: FLONASE  Place 2 sprays into both nostrils daily.   hydrocortisone  2.5 % cream Apply topically 2 (two) times daily.   ibuprofen  200 MG tablet Commonly known as: ADVIL  Take 3 tablets (600 mg total) by mouth every 8 (eight) hours as needed for mild pain (pain score 1-3) or moderate pain (pain score 4-6).   montelukast  10 MG tablet Commonly known as: SINGULAIR  Take 1 tablet (10 mg total) by mouth at bedtime.   oxyCODONE  5 MG immediate release tablet Commonly known as: Oxy IR/ROXICODONE  Take 1 tablet (5 mg total) by mouth every 6 (six) hours as needed for severe pain (pain score 7-10).   tacrolimus  0.1 % ointment Commonly known as: Protopic  Apply topically 2 (two) times daily. What changed:  how much to take when to take this reasons to take this   triamcinolone  ointment 0.1 % Commonly known as: KENALOG  1 application twice a day as needed for mild flares avoiding face groin and arm pits   TYLENOL  500 MG tablet Generic drug: acetaminophen  Take 500-1,000 mg by mouth every 6 (six) hours as needed for mild pain (pain score 1-3) (or headaches).          Follow-up Information  Maczis, Puja Gosai, PA-C Follow up.   Specialty: General Surgery Why: our office is scheduling you for post-operative follow up. call to confirm appointment date/time. Contact information: [address] [place] [address] [phone]                 Signed: Michial Akin, [facility] 04/11/2024, 3:13 PM

## 2024-04-12 MED FILL — Sodium Chloride Flush IV Soln 0.9%: INTRAVENOUS | Qty: 10 | Status: AC

## 2024-04-12 MED FILL — Ondansetron HCl Inj 4 MG/2ML (2 MG/ML): INTRAMUSCULAR | Qty: 2 | Status: AC

## 2024-07-23 ENCOUNTER — Other Ambulatory Visit: Payer: Self-pay | Admitting: Internal Medicine

## 2024-09-14 ENCOUNTER — Encounter: Payer: Self-pay | Admitting: Dermatology

## 2024-09-14 ENCOUNTER — Ambulatory Visit: Admitting: Dermatology

## 2024-09-14 DIAGNOSIS — L309 Dermatitis, unspecified: Secondary | ICD-10-CM | POA: Diagnosis not present

## 2024-09-14 DIAGNOSIS — L811 Chloasma: Secondary | ICD-10-CM

## 2024-09-14 DIAGNOSIS — L700 Acne vulgaris: Secondary | ICD-10-CM

## 2024-09-14 DIAGNOSIS — L810 Postinflammatory hyperpigmentation: Secondary | ICD-10-CM

## 2024-09-14 DIAGNOSIS — L209 Atopic dermatitis, unspecified: Secondary | ICD-10-CM

## 2024-09-14 DIAGNOSIS — L819 Disorder of pigmentation, unspecified: Secondary | ICD-10-CM

## 2024-09-14 MED ORDER — HYDROQUINONE 4 % EX CREA: TOPICAL_CREAM | Freq: Two times a day (BID) | CUTANEOUS | 1 refills | Status: AC

## 2024-09-14 MED ORDER — DOXYCYCLINE MONOHYDRATE 100 MG PO CAPS: 100.0000 mg | ORAL_CAPSULE | Freq: Two times a day (BID) | ORAL | 1 refills | Status: DC

## 2024-09-14 MED ORDER — TRANEXAMIC ACID 650 MG PO TABS: 650.0000 mg | ORAL_TABLET | Freq: Every day | ORAL | 1 refills | Status: DC

## 2024-09-14 MED ORDER — HYDROCORTISONE 2.5 % EX CREA: TOPICAL_CREAM | Freq: Two times a day (BID) | CUTANEOUS | 1 refills | Status: AC | PRN

## 2024-09-14 NOTE — Patient Instructions (Addendum)
 Eczema around eyes Start Hydrocortisone  2.5% cream twice a day around eyes until area soft and smooth then stop Start Cerave hydrating cleanser to wash face Stop make up to face  Acne Face Start Doxycycline 100mg  1 pill twice a day with food and drink Start Cerave hydrating cleanser to wash face  Melasma around the mouth Start Hydroquinone 4% cream twice a day to around the mouth Start Tranexamic Acid 650mg  1 pill a day    Due to recent changes in healthcare laws, you may see results of your pathology and/or laboratory studies on MyChart before the doctors have had a chance to review them. We understand that in some cases there may be results that are confusing or concerning to you. Please understand that not all results are received at the same time and often the doctors may need to interpret multiple results in order to provide you with the best plan of care or course of treatment. Therefore, we ask that you please give us  2 business days to thoroughly review all your results before contacting the office for clarification. Should we see a critical lab result, you will be contacted sooner.   If You Need Anything After Your Visit  If you have any questions or concerns for your doctor, please call our main line at [phone] and press option 4 to reach your doctor's medical assistant. If no one answers, please leave a voicemail as directed and we will return your call as soon as possible. Messages left after 4 pm will be answered the following business day.   You may also send us  a message via MyChart. We typically respond to MyChart messages within 1-2 business days.  For prescription refills, please ask your pharmacy to contact our office. Our fax number is [phone].  If you have an urgent issue when the clinic is closed that cannot wait until the next business day, you can page your doctor at the number below.    Please note that while we do our best to be available for urgent  issues outside of office hours, we are not available 24/7.   If you have an urgent issue and are unable to reach us , you may choose to seek medical care at your doctor's office, retail clinic, urgent care center, or emergency room.  If you have a medical emergency, please immediately call 911 or go to the emergency department.  Pager Numbers  - Dr. Hester: [phone]  - Dr. Jackquline: [phone]  - Dr. Claudene: [phone]   - Dr. Raymund: [phone]  In the event of inclement weather, please call our main line at [phone] for an update on the status of any delays or closures.  Dermatology Medication Tips: Please keep the boxes that topical medications come in in order to help keep track of the instructions about where and how to use these. Pharmacies typically print the medication instructions only on the boxes and not directly on the medication tubes.   If your medication is too expensive, please contact our office at [phone] option 4 or send us  a message through MyChart.   We are unable to tell what your co-pay for medications will be in advance as this is different depending on your insurance coverage. However, we may be able to find a substitute medication at lower cost or fill out paperwork to get insurance to cover a needed medication.   If a prior authorization is required to get your medication covered by your insurance company, please allow us  1-2  business days to complete this process.  Drug prices often vary depending on where the prescription is filled and some pharmacies may offer cheaper prices.  The website www.goodrx.com contains coupons for medications through different pharmacies. The prices here do not account for what the cost may be with help from insurance (it may be cheaper with your insurance), but the website can give you the price if you did not use any insurance.  - You can print the associated coupon and take it with your prescription to the  pharmacy.  - You may also stop by our office during regular business hours and pick up a GoodRx coupon card.  - If you need your prescription sent electronically to a different pharmacy, notify our office through [facility] or by phone at [phone] option 4.     Si Usted Necesita Algo Despus de Su Visita  Tambin puede enviarnos un mensaje a travs de Clinical Cytogeneticist. Por lo general respondemos a los mensajes de MyChart en el transcurso de 1 a 2 das hbiles.  Para renovar recetas, por favor pida a su farmacia que se ponga en contacto con nuestra oficina. Randi lakes de fax es [place] [phone].  Si tiene un asunto urgente cuando la clnica est cerrada y que no puede esperar hasta el siguiente da hbil, puede llamar/localizar a su doctor(a) al nmero que aparece a continuacin.   Por favor, tenga en cuenta que aunque hacemos todo lo posible para estar disponibles para asuntos urgentes fuera del horario de [place], no estamos disponibles las 24 horas del da, los 7 [address]  [address] de la [place].   Si tiene un problema urgente y no puede comunicarse con nosotros, puede optar por buscar atencin mdica  en el consultorio de su doctor(a), en una clnica privada, en un centro de atencin urgente o en una sala de emergencias.  Si tiene engineer, drilling, por favor llame inmediatamente al 911 o vaya a la sala de emergencias.  Nmeros de bper  - Dr. Hester: [phone]  - Dra. Jackquline: [phone]  - Dr. Claudene: [phone]  - Dra. Kitts: [phone]  En caso de inclemencias del [place], por favor llame a nuestra lnea principal al [phone] para una actualizacin sobre el estado de cualquier retraso o cierre.  Consejos para la medicacin en dermatologa: Por favor, guarde las cajas en las que vienen los medicamentos de uso tpico para ayudarle a seguir las instrucciones sobre dnde y cmo usarlos. Las farmacias generalmente imprimen las instrucciones del medicamento slo en las  cajas y no directamente en los tubos del [place].   Si su medicamento es muy caro, por favor, pngase en contacto con landry rieger llamando al [phone] y presione la opcin 4 o envenos un mensaje a travs de Clinical Cytogeneticist.   No podemos decirle cul ser su copago por los medicamentos por adelantado ya que esto es diferente dependiendo de la cobertura de su seguro. Sin embargo, es posible que podamos encontrar un medicamento sustituto a audiological scientist un formulario para que el seguro cubra el medicamento que se considera necesario.   Si se requiere una autorizacin previa para que su compaa de seguros cubra su medicamento, por favor permtanos de 1 a 2 das hbiles para completar este proceso.  Los precios de los medicamentos varan con frecuencia dependiendo del environmental consultant de dnde se surte la receta y alguna farmacias pueden ofrecer precios ms baratos.  El sitio web www.goodrx.com tiene cupones para medicamentos de health and safety inspector. Los precios aqu no tienen en cuenta  lo que podra costar con la ayuda del seguro (puede ser ms barato con su seguro), pero el sitio web puede darle el precio si no visual merchandiser.  - Puede imprimir el cupn correspondiente y llevarlo con su receta a la farmacia.  - Tambin puede pasar por nuestra oficina durante el horario de atencin regular y education officer, museum una tarjeta de cupones de GoodRx.  - Si necesita que su receta se enve electrnicamente a una farmacia diferente, informe a nuestra oficina a travs de MyChart de [place] o por telfono llamando al [phone] y presione la opcin 4.

## 2024-09-14 NOTE — Progress Notes (Signed)
 New Patient Visit   Subjective  Kerry Smith is a 33 y.o. female who presents for the following: melasma and hyperpigmentation face for yrs, has tried otc Adalane 1% cr (adapalene clinda), La Roche Posay Effaclar rash around mouth and eyes ~1wk, pt recently switched toothpaste, and tried a new make up on 08/25/24, pt has hx of eczema face and has used Tacrolimus  0.1% oint in past, no hx of blood clots, hx of heavy menstrual cycles  Patient accompanied by mother who contributes to history.   The following portions of the chart were reviewed this encounter and updated as appropriate: medications, allergies, medical history  Review of Systems:  No other skin or systemic complaints except as noted in HPI or Assessment and Plan.  Objective  Well appearing patient in no apparent distress; mood and affect are within normal limits.   A focused examination was performed of the following areas: Face, chest, arms  Relevant exam findings are noted in the Assessment and Plan.   Face              Assessment & Plan   ACNE VULGARIS Face, chest Exam: inflammatory papules lateral face along hair line, chest, hyperpigmented macules face, chest  Chronic and persistent condition with duration or expected duration over one year. Condition is bothersome/symptomatic for patient. Currently flared.  Treatment Plan: Start Doxycycline 100mg  1 po bid  Doxycycline should be taken with food to prevent nausea. Do not lay down for 30 minutes after taking. Be cautious with sun exposure and use good sun protection while on this medication. Pregnant women should not take this medication.    ECZEMA and PIH, failed tacrolimus  periocular Exam: Scaly pink hyperpigmented papules coalescing to plaques periocular b/l  Atopic dermatitis (eczema) is a chronic, relapsing, pruritic condition that can significantly affect quality of life. It is often associated with allergic rhinitis and/or asthma and can  require treatment with topical medications, phototherapy, or in severe cases biologic injectable medication (Dupixent, Adbry, Ebglyss) or oral JAK inhibitors (Rinvoq, Cibinqo).    Treatment Plan: Start Cerave hydrating cleanser qd Start HC 2.5% cr qd periocular until clear, risk of periorificial dermatitis Stop makeup and other products on face until clear Topical steroids (such as triamcinolone , fluocinolone, fluocinonide, mometasone, clobetasol , halobetasol, betamethasone, hydrocortisone ) can cause thinning and lightening of the skin if they are used for too long in the same area. Your physician has selected the right strength medicine for your problem and area affected on the body. Please use your medication only as directed by your physician to prevent side effects.    Perioral hyperpigmentation/melasma Exam: hyperpigmented patches perioral  Chronic and persistent condition with duration or expected duration over one year. Condition is symptomatic / bothersome to patient. Not to goal.   Melasma is a chronic; persistent condition of hyperpigmented patches generally on the face, worse in summer due to higher UV exposure.    Heredity; thyroid disease; sun exposure; pregnancy; birth control pills; epilepsy medication and darker skin may predispose to Melasma.   Recommendations include: - Sun avoidance and daily broad spectrum (UVA/UVB) tinted mineral sunscreen SPF 30+, with Zinc or Titanium Dioxide. - Rx topical bleaching creams (i.e. hydroquinone) is a common treatment but should not be used long term.  Hydroquinones may be mixed with retinoids; vitamin C; steroids; Kojic Acid. - Alastin A-luminate, retinoids, vitamin C, topical tranexamic acid, glycolic acid and kojic acid can be used for brightening while on break from hydroquinone - Rx Azelaic Acid is also a  treatment option that is safe for pregnancy (Category B). - OTC Heliocare can be helpful in control and prevention. - Oral Rx with  Tranexamic Acid 250 mg - 650 mg po daily can be used for moderate to severe cases especially during summer (contraindications include pregnancy; lactation; hx of PE; hx of DVT; clotting disorder; heart disease; anticoagulant use and upcoming long trips)   - Chemical peels (would need multiple for best result).  - Lasers and  Microdermabrasion may also be helpful adjunct treatments.  Treatment Plan: no hx of thrombosis, does not use contraceptive, no hx of bleeding disorder, has heavy menses Start Hydroquinine 4% cr bid to dark area around the mouth, use up to 3 months then stop, risk ochronosis Start Tranexamic Acid 650 mg 1 po qd Discussed risks of oral tranexamic acid : Commonback to top  Gastrointestinal: Abdominal pain, Diarrhea (12.2% ), Nausea (14.4% ) Hematologic: Anemia (5.6% ) Musculoskeletal: Arthralgia, Backache ( 20.7% to 31.4% ), Cramp, Musculoskeletal pain ( 11.2%), Spasm Neurologic: Headache ( 50.4% to 60.4% ), Migraine (6% to 10.8% ) Respiratory: Nasal sinus problem (25.4% ) Other: Fatigue (5.2% )     Seriousback to top  Cardiovascular: Cardiac dysrhythmia Hematologic: Thromboembolic disorder Immunologic: Anaphylaxis, Non-allergic anaphylaxis Neurologic: Cerebral ischemia, Seizure Ophthalmic: Retinal artery occlusion, Thrombosis of retinal vein, Visual disturbance Respiratory: Pulmonary embolism Recommend daily broad spectrum (UVA/UVB) tinted mineral sunscreen SPF 30+, with Zinc or Titanium Dioxide.  Reviewed risk of permanent dark spots (ochronosis) if hydroquinone is used longer than 3 months.    ACNE VULGARIS   Related Medications doxycycline (MONODOX) 100 MG capsule Take 1 capsule (100 mg total) by mouth 2 (two) times daily. Take with food and drink ATOPIC DERMATITIS, UNSPECIFIED TYPE   Related Medications hydrocortisone  2.5 % cream Apply topically 2 (two) times daily as needed (Rash). Bid to rash around eyes for eczema until resolved PERIORAL  HYPERPIGMENTATION   Related Medications tranexamic acid (LYSTEDA) 650 MG TABS tablet Take 1 tablet (650 mg total) by mouth daily. hydroquinone 4 % cream Apply topically 2 (two) times daily. Bid to  dark spots on face for melasma MELASMA   Related Medications tranexamic acid (LYSTEDA) 650 MG TABS tablet Take 1 tablet (650 mg total) by mouth daily. POSTINFLAMMATORY HYPERPIGMENTATION    Return in 2 months (on 11/14/2024) for 2-60m f/u for eczema, acen, melasma.  I, Grayce Saunas, RMA, am acting as scribe for Boneta Sharps, MD .   Documentation: I have reviewed the above documentation for accuracy and completeness, and I agree with the above.  Boneta Sharps, MD

## 2024-09-18 ENCOUNTER — Encounter: Payer: Self-pay | Admitting: Dermatology

## 2024-09-20 ENCOUNTER — Other Ambulatory Visit: Payer: Self-pay | Admitting: Dermatology

## 2024-09-20 NOTE — Telephone Encounter (Signed)
 Patient called asking for update.   She said we can return her call or reply via MyChart. aw

## 2024-10-23 ENCOUNTER — Telehealth: Payer: Self-pay

## 2024-10-23 NOTE — Telephone Encounter (Signed)
 Patient called about questions regarding treatment for acne and melasma  Patient states that she has noticed some darkening about mouth since starting doxycycline  and would like to know if she should continue treatment.   She also states hydroquinone  prescribed for treatment of melasma she has noticed some redness and itching and would like to know if she could be prescribed something gentle such as azelaic acid cream   Discussed will route questions to Dr. Claudene to further advise.

## 2024-11-05 ENCOUNTER — Other Ambulatory Visit: Payer: Self-pay | Admitting: Dermatology

## 2024-11-05 ENCOUNTER — Encounter: Payer: Self-pay | Admitting: Dermatology

## 2024-11-05 DIAGNOSIS — L814 Other melanin hyperpigmentation: Secondary | ICD-10-CM

## 2024-11-05 DIAGNOSIS — L700 Acne vulgaris: Secondary | ICD-10-CM

## 2024-11-05 MED ORDER — AZELAIC ACID 15 % EX GEL: CUTANEOUS | 0 refills | Status: AC

## 2024-11-05 MED ORDER — CLINDAMYCIN PHOSPHATE 1 % EX LOTN: TOPICAL_LOTION | Freq: Two times a day (BID) | CUTANEOUS | 0 refills | Status: AC

## 2024-11-13 ENCOUNTER — Ambulatory Visit: Admitting: Dermatology

## 2024-11-16 ENCOUNTER — Other Ambulatory Visit: Payer: Self-pay | Admitting: Dermatology

## 2024-11-16 ENCOUNTER — Other Ambulatory Visit: Payer: Self-pay

## 2024-11-16 ENCOUNTER — Telehealth: Payer: Self-pay

## 2024-11-16 DIAGNOSIS — L811 Chloasma: Secondary | ICD-10-CM

## 2024-11-16 DIAGNOSIS — L819 Disorder of pigmentation, unspecified: Secondary | ICD-10-CM

## 2024-11-16 NOTE — Telephone Encounter (Signed)
 Patient called requesting a refill of Tranexamic acid , she has an appt scheduled Monday Jan 19 but she is afraid it may snow Sunday and she will not be able to travel here from [place], pt  would like to reschedule to another day

## 2024-11-20 ENCOUNTER — Ambulatory Visit: Admitting: Dermatology
